# Patient Record
Sex: Female | Born: 1965 | Race: Black or African American | Hispanic: No | Smoking: Never smoker
Health system: Southern US, Community
[De-identification: ages and names within clinical notes are randomized; demographics above are authoritative.]

## PROBLEM LIST (undated history)

## (undated) DIAGNOSIS — J329 Chronic sinusitis, unspecified: Secondary | ICD-10-CM

## (undated) DIAGNOSIS — L639 Alopecia areata, unspecified: Secondary | ICD-10-CM

## (undated) DIAGNOSIS — U071 COVID-19: Secondary | ICD-10-CM

## (undated) DIAGNOSIS — M65311 Trigger thumb, right thumb: Secondary | ICD-10-CM

## (undated) HISTORY — PX: CHOLECYSTECTOMY: SHX55

## (undated) HISTORY — DX: Chronic sinusitis, unspecified: J32.9

## (undated) HISTORY — DX: COVID-19: U07.1

## (undated) HISTORY — PX: OTHER SURGICAL HISTORY: SHX169

## (undated) HISTORY — PX: REDUCTION MAMMAPLASTY: SUR839

## (undated) HISTORY — DX: Alopecia areata, unspecified: L63.9

## (undated) HISTORY — DX: Trigger thumb, right thumb: M65.311

---

## 1997-05-30 HISTORY — PX: BREAST REDUCTION SURGERY: SHX8

## 2004-10-28 ENCOUNTER — Other Ambulatory Visit: Payer: Self-pay

## 2004-10-28 ENCOUNTER — Emergency Department: Payer: Self-pay | Admitting: Emergency Medicine

## 2005-03-10 ENCOUNTER — Ambulatory Visit: Payer: Self-pay | Admitting: Family Medicine

## 2005-03-30 ENCOUNTER — Ambulatory Visit: Payer: Self-pay | Admitting: Family Medicine

## 2006-08-30 ENCOUNTER — Emergency Department: Payer: Self-pay | Admitting: Emergency Medicine

## 2006-10-11 ENCOUNTER — Encounter: Payer: Self-pay | Admitting: Orthopedic Surgery

## 2006-10-29 ENCOUNTER — Encounter: Payer: Self-pay | Admitting: Orthopedic Surgery

## 2009-03-07 ENCOUNTER — Emergency Department: Payer: Self-pay | Admitting: Unknown Physician Specialty

## 2009-05-30 ENCOUNTER — Emergency Department: Payer: Self-pay | Admitting: Emergency Medicine

## 2009-07-28 ENCOUNTER — Emergency Department: Payer: Self-pay | Admitting: Emergency Medicine

## 2009-10-22 ENCOUNTER — Ambulatory Visit: Payer: Self-pay | Admitting: Family Medicine

## 2010-10-26 ENCOUNTER — Ambulatory Visit: Payer: Self-pay | Admitting: Family Medicine

## 2012-06-24 ENCOUNTER — Emergency Department: Payer: Self-pay | Admitting: Internal Medicine

## 2012-06-25 LAB — BETA STREP CULTURE(ARMC)

## 2014-10-19 ENCOUNTER — Emergency Department
Admission: EM | Admit: 2014-10-19 | Discharge: 2014-10-20 | Discharge status: Against Medical Advice | Payer: BC Managed Care – PPO | Attending: Emergency Medicine | Admitting: Emergency Medicine

## 2014-10-19 DIAGNOSIS — N939 Abnormal uterine and vaginal bleeding, unspecified: Secondary | ICD-10-CM | POA: Insufficient documentation

## 2014-10-19 LAB — CBC
HCT: 33.8 % — ABNORMAL LOW (ref 35.0–47.0)
Hemoglobin: 11 g/dL — ABNORMAL LOW (ref 12.0–16.0)
MCH: 27.8 pg (ref 26.0–34.0)
MCHC: 32.5 g/dL (ref 32.0–36.0)
MCV: 85.4 fL (ref 80.0–100.0)
Platelets: 381 10*3/uL (ref 150–440)
RBC: 3.96 MIL/uL (ref 3.80–5.20)
RDW: 16 % — ABNORMAL HIGH (ref 11.5–14.5)
WBC: 8.3 10*3/uL (ref 3.6–11.0)

## 2014-10-19 LAB — URINALYSIS COMPLETE WITH MICROSCOPIC (ARMC ONLY)
BILIRUBIN URINE: NEGATIVE
Bacteria, UA: NONE SEEN
GLUCOSE, UA: NEGATIVE mg/dL
KETONES UR: NEGATIVE mg/dL
LEUKOCYTES UA: NEGATIVE
Nitrite: NEGATIVE
PH: 5 (ref 5.0–8.0)
Protein, ur: NEGATIVE mg/dL
SPECIFIC GRAVITY, URINE: 1.018 (ref 1.005–1.030)

## 2014-10-19 NOTE — ED Notes (Signed)
Pt ambulatory to registration desk with no difficulty. Pt reports she has been having intermittent vaginal bleeding for 4 months. Today the bleeding is heavier and reports she is passing clots.

## 2014-10-19 NOTE — ED Notes (Signed)
Pt states she has had vaginal bleeding for several months.

## 2015-01-31 ENCOUNTER — Emergency Department
Admission: EM | Admit: 2015-01-31 | Discharge: 2015-01-31 | Disposition: A | Discharge status: Home / Self Care | Payer: BC Managed Care – PPO | Attending: Emergency Medicine | Admitting: Emergency Medicine

## 2015-01-31 ENCOUNTER — Emergency Department: Payer: BC Managed Care – PPO

## 2015-01-31 DIAGNOSIS — M25571 Pain in right ankle and joints of right foot: Secondary | ICD-10-CM | POA: Diagnosis not present

## 2015-01-31 DIAGNOSIS — M79671 Pain in right foot: Secondary | ICD-10-CM

## 2015-01-31 MED ORDER — ETODOLAC 400 MG PO TABS: 400.0000 mg | ORAL_TABLET | Freq: Two times a day (BID) | ORAL | Status: DC

## 2015-01-31 NOTE — ED Provider Notes (Signed)
[facility] Emergency Department Provider Note  ____________________________________________  Time seen: Approximately 9:30 PM  I have reviewed the triage vital signs and the nursing notes.   HISTORY  Chief Complaint Ankle Pain   HPI Brianna Silva is a 49 y.o. female is here complaining of right ankle pain for 3 weeks. She is uncertain of any injury and states the pain is worse today. She has taken occasional over-the-counter anti-inflammatory without any relief. She has not taken anything for 2 days. She denies any previous injury to her ankle or foot. She has continued to be ambulatory during the 3 weeks. She rates her pain as an 8 out of 10.   No past medical history on file.  There are no active problems to display for this patient.   No past surgical history on file.  Current Outpatient Rx  Name  Route  Sig  Dispense  Refill  . etodolac (LODINE) 400 MG tablet   Oral   Take 1 tablet (400 mg total) by mouth 2 (two) times daily.   20 tablet   0     Allergies Review of patient's allergies indicates no known allergies.  No family history on file.  Social History Social History  Substance Use Topics  . Smoking status: Not on file  . Smokeless tobacco: Not on file  . Alcohol Use: Not on file    Review of Systems Constitutional: No fever/chills Cardiovascular: Denies chest pain. Respiratory: Denies shortness of breath. Gastrointestinal: No abdominal pain.  No nausea, no vomiting. Musculoskeletal: Negative for back pain. Skin: Negative for rash. Neurological: Negative for headaches, focal weakness or numbness.  10-point ROS otherwise negative.  ____________________________________________   PHYSICAL EXAM:  VITAL SIGNS: ED Triage Vitals  Enc Vitals Group     BP 01/31/15 2105 133/66 mmHg     Pulse Rate 01/31/15 2105 80     Resp 01/31/15 2105 18     Temp 01/31/15 2105 98.6 F (37 C)     Temp Source 01/31/15 2105 Oral   SpO2 01/31/15 2105 96 %     Weight 01/31/15 2105 200 lb (90.719 kg)     Height 01/31/15 2105  (1.676 m)     Head Cir --      Peak Flow --      Pain Score 01/31/15 2109 8     Pain Loc --      Pain Edu? --      Excl. in GC? --     Constitutional: Alert and oriented. Well appearing and in no acute distress. Eyes: Conjunctivae are normal. PERRL. EOMI. Head: Atraumatic. Nose: No congestion/rhinnorhea. Neck: No stridor.   Cardiovascular: Normal rate, regular rhythm. Grossly normal heart sounds.  Good peripheral circulation. Respiratory: Normal respiratory effort.  No retractions. Lungs CTAB. Gastrointestinal: Soft and nontender. No distention. Musculoskeletal: Right foot and ankle there is no gross deformity. There is moderate tenderness on palpation of the dorsal aspect of the right foot. There is no edema present. There is no warmth or redness noted. Digits move without restriction Neurologic:  Normal speech and language. No gross focal neurologic deficits are appreciated. No gait instability. Skin:  Skin is warm, dry and intact. No rash noted. No erythema, ecchymosis or abrasions Psychiatric: Mood and affect are normal. Speech and behavior are normal.  ____________________________________________   LABS (all labs ordered are listed, but only abnormal results are displayed)  Labs Reviewed - No data to display RADIOLOGY  Right foot x-ray was negative.  I, Tommi Rumps, personally viewed and evaluated these images (plain radiographs) as part of my medical decision making.  ____________________________________________   PROCEDURES  Procedure(s) performed: None  Critical Care performed: No  ____________________________________________   INITIAL IMPRESSION / ASSESSMENT AND PLAN / ED COURSE  Pertinent labs & imaging results that were available during my care of the patient were reviewed by me and considered in my medical decision making (see chart for details)  Patient  was started on etodolac twice a day. She will also follow up with podiatrist on call if any continued  problems..   ____________________________________________   FINAL CLINICAL IMPRESSION(S) / ED DIAGNOSES  Final diagnoses:  Foot pain, right      Tommi Rumps, PA-C 02/01/15 [id number]  Arnaldo Natal, MD 02/01/15 [phone]

## 2015-01-31 NOTE — ED Notes (Signed)
Pt c/o right ankle pain for 3 weeks; denies injury; says pain is worse today and she has noticed a "knot"; pt in no acute distress

## 2015-01-31 NOTE — ED Notes (Signed)
PA Rhonda at bedside. 

## 2015-06-10 NOTE — H&P (Signed)
GYN PRE-OP VISIT NOTE  CC: heavy menstrual bleeding, surgical planning  Subjective:    Brianna Silva is a 50 y.o. female who presents for surgical planning for menometrorrhagia.   Started on provera 10mg  daily on 12/23. Finally stopped bleeding on 1/3. Embx done was neg for EIN. TVUS with 1 fundal fibroid 3.3cm, normal EMS (6.5448mm).   Hx of AUB: Patient has been bleeding daily since Thanksgiving with clots as big as golf balls. Going through a box of tampons per week. Associated Pelvic pressure, cramping. No significant pain. Diagnosed with anemia by Family Medicine, started on iron. Had been taking birth control pills, but stopped because her bleeding was still continuing. She is still fatigued.   She has also noted feeling colder more often, but no changes in hair or skin. She experiences the occasional hot flash over the past year. She has noticed that her menses have been heavier with more clots over the past year. No galactorrhea, no changes in breasts (does self breast exams).  This is the first time she has had abnormal bleeding. Her mother had needed a hysterctomy for bleeding. Her sister needed a hysterectomy for abnormal cervical cells. She feels like she will probably need a hysterectomy.   Gynecologic History Patient's last menstrual period was 04/23/2015. Contraception: bilateral tubal ligation 6251yrs ago Sexually active: yes Desires STD screening: no  Last Pap: been a while. Results were: normal Last mammogram: last year. Results were: normal   Menarche: 12 Menses Q28days 3-4 days, heavy - more clots over this past year   Obstetric History                      OB History  Gravida Para Term Preterm AB SAB TAB Ectopic Multiple Living  4 2 2  2 2    2     # Outcome Date GA Lbr Len/2nd Weight Sex Delivery Anes PTL Lv  4 Term           3 Term           2 SAB           1 SAB             NSVD  C/S BTL  Past  Medical History:  has a past medical history of Anemia. Problem List: has Abnormal uterine bleeding (AUB) on her problem list. Past Surgical History:  has a past surgical history that includes Cesarean section; Reduction mammaplasty; and gallstone removal. Family History: family history includes Cirrhosis in her father; Hyperlipidemia in her mother; Thyroid disease in her mother. Social History:  reports that she has never smoked. She has never used smokeless tobacco. She reports that she does not drink alcohol or use illicit drugs. Current Medications: has a current medication list which includes the following prescription(s): cyanocobalamin (vitamin b-12), ferrous sulfate, ginger root, and medroxyprogesterone. Prior to encounter Medications:        Current Outpatient Prescriptions on File Prior to Visit  Medication Sig Dispense Refill  . CYANOCOBALAMIN, VITAMIN B-12, (VITAMIN B-12 ORAL) Take by mouth.    . ferrous sulfate 325 (65 FE) MG tablet Take 325 mg by mouth 2 (two) times daily with meals.    Marland Kitchen. GINGER ROOT (GINGER EXTRACT ORAL) Take by mouth.     No current facility-administered medications on file prior to visit.    Allergies: has No Known Allergies.  Review of Systems 14 systems reviewed pertinent positives and negatives as noted in the HPI and below.  Objective:       Vitals:   06/04/15 0902  BP: (!) 118/102  Pulse: 80   General appearance: alert, appears stated age and cooperative Head: Normocephalic, without obvious abnormality, atraumatic Eyes: conjunctivae/corneas clear. PERRL, EOM's intact. Fundi benign. Sclera anicteric.  CV: RRR PULM: CTAB Abdomen: soft, non-tender; bowel sounds normal; no masses, no organomegaly Pelvic: cervix normal in appearance, external genitalia normal, no adnexal masses or tenderness, no bladder tenderness, no cervical motion tenderness, rectovaginal septum normal, urethra without abnormality or discharge, vagina normal without  discharge and uterus feels enlarged, maybe 12 week size, exam limited by body habitus; 1cm left labial cyst Extremities: extremities normal, atraumatic, no cyanosis or edema Skin: Skin color, texture, turgor normal. No rashes or lesions Lymph nodes: Inguinal adenopathy: none     TVUS:  06/04/15 - Uterus 7.92x4.6cm, EMS 6.45mm, Lt simple ovarian cyst 1.61cm, one fundal fibroid 3cm  EMBX: ENDOMETRIUM, BIOPSY:  NO HYPERPLASIA OR CARCINOMA. INACTIVE ENDOMETRIUM WITH DECIDUALIZED  STROMA.  TSH wnl  Assessment:    49yo [id number] with menometrorrhagia,likely due to fibroid uterus or HPO-axis dysfunction. Also w/ acute blood loss anemia on iron. We discussed hormonal management, novasure ablation, and definitive therapy with hysterectomy. After discussed all the r/b/a, patient desires hysterectomy.  Plan:    1. AUB, likely due to fibroid uterus or HPO-axis dysfunction - r/b/a of hysterectomy discussed with patient, including bleeding, infection, pain, possible bowel/bladder injury, possible transfusion - provera 10mg  daily until surgery  - cont iron for iron deficiency anemia - Counseled on TLH/BS, left labial cyst excision - we discussed possible need for laparotomy due to previous vertical c/s  2. HCM - pap/HPV NILM - patient has mammogram ordered  3. Possible essential hypertension - elevated BPs on two occasions - will order EKG for preop testing  RTC for preop visit  I spent 30 minutes with the patient with greater than 50% spent counseling  Burnett Corrente, MD

## 2015-06-11 ENCOUNTER — Encounter
Admission: RE | Admit: 2015-06-11 | Discharge: 2015-06-11 | Disposition: A | Discharge status: Home / Self Care | Payer: BC Managed Care – PPO | Source: Ambulatory Visit | Attending: Obstetrics and Gynecology | Admitting: Obstetrics and Gynecology

## 2015-06-11 DIAGNOSIS — Z01812 Encounter for preprocedural laboratory examination: Secondary | ICD-10-CM | POA: Insufficient documentation

## 2015-06-11 LAB — TYPE AND SCREEN
ABO/RH(D): B POS
Antibody Screen: NEGATIVE

## 2015-06-11 LAB — CBC
HCT: 37.3 % (ref 35.0–47.0)
Hemoglobin: 11.7 g/dL — ABNORMAL LOW (ref 12.0–16.0)
MCH: 26.8 pg (ref 26.0–34.0)
MCHC: 31.3 g/dL — ABNORMAL LOW (ref 32.0–36.0)
MCV: 85.7 fL (ref 80.0–100.0)
PLATELETS: 387 10*3/uL (ref 150–440)
RBC: 4.36 MIL/uL (ref 3.80–5.20)
RDW: 24.1 % — AB (ref 11.5–14.5)
WBC: 7.1 10*3/uL (ref 3.6–11.0)

## 2015-06-11 LAB — COMPREHENSIVE METABOLIC PANEL
ALBUMIN: 3.7 g/dL (ref 3.5–5.0)
ALT: 11 U/L — ABNORMAL LOW (ref 14–54)
AST: 16 U/L (ref 15–41)
Alkaline Phosphatase: 49 U/L (ref 38–126)
Anion gap: 7 (ref 5–15)
BUN: 9 mg/dL (ref 6–20)
CHLORIDE: 106 mmol/L (ref 101–111)
CO2: 25 mmol/L (ref 22–32)
Calcium: 8.8 mg/dL — ABNORMAL LOW (ref 8.9–10.3)
Creatinine, Ser: 0.64 mg/dL (ref 0.44–1.00)
GFR calc Af Amer: >60 mL/min (ref >60)
GFR calc non Af Amer: >60 mL/min (ref >60)
GLUCOSE: 110 mg/dL — AB (ref 65–99)
POTASSIUM: 3.7 mmol/L (ref 3.5–5.1)
Sodium: 138 mmol/L (ref 135–145)
Total Bilirubin: 0.5 mg/dL (ref 0.3–1.2)
Total Protein: 7.8 g/dL (ref 6.5–8.1)

## 2015-06-11 LAB — ABO/RH: ABO/RH(D): B POS

## 2015-06-11 NOTE — Patient Instructions (Signed)
  Your procedure is scheduled on: Friday Jan. 20, 2017. Report to Same Day Surgery. To find out your arrival time please call [phone] between 1PM - 3PM on Thursday Jan. 19, 2917  Remember: Instructions that are not followed completely may result in serious medical risk, up to and including death, or upon the discretion of your surgeon and anesthesiologist your surgery may need to be rescheduled.    _x___ 1. Do not eat food or drink liquids after midnight. No gum chewing or hard candies.     ____ 2. No Alcohol for 24 hours before or after surgery.   ____ 3. Bring all medications with you on the day of surgery if instructed.    __x__ 4. Notify your doctor if there is any change in your medical condition     (cold, fever, infections).     Do not wear jewelry, make-up, hairpins, clips or nail polish.  Do not wear lotions, powders, or perfumes. You may wear deodorant.  Do not shave 48 hours prior to surgery. Men may shave face and neck.  Do not bring valuables to the hospital.    [facility] is not responsible for any belongings or valuables.               Contacts, dentures or bridgework may not be worn into surgery.  Leave your suitcase in the car. After surgery it may be brought to your room.  For patients admitted to the hospital, discharge time is determined by your treatment team.   Patients discharged the day of surgery will not be allowed to drive home.    Please read over the following fact sheets that you were given:   [facility]  ____ Take these medicines the morning of surgery with A SIP OF WATER: NONE     ____ Fleet Enema (as directed)   _x___ Use CHG Soap as directed  ____ Use inhalers on the day of surgery  ____ Stop metformin 2 days prior to surgery    ____ Take 1/2 of usual insulin dose the night before surgery and none on the morning of surgery.   ____ Stop Coumadin/Plavix/aspirin on does not apply  _x___ Stop  Anti-inflammatories such as Ibuprofen now.  OK to take Tylenol.   _x___ Stop supplements ginger and Vitamin B12. until after surgery.    ____ Bring C-Pap to the hospital.

## 2015-06-12 NOTE — Pre-Procedure Instructions (Addendum)
Abnormal EKG clearance requested (Dr Henrene HawkingKephart) faxed and [facility]. Surgeon notified Karoline Caldwell(Angie)

## 2015-06-18 NOTE — Pre-Procedure Instructions (Signed)
Clearance Note  Brianna Mull, DO - 06/16/2015 3:20 PM EST Formatting of this note may be different from the original. Chief Complaint  Patient presents with  . needs ekg for surgical clearance   HPI: Brianna Silva is a 50 y.o. female here for evaluation of the following:  She has a total hysterectomy scheduled in 4 days. Her obgyn office did a preop exam and her BP was elevated so they also did an EKG (no past dx of HTN). The EKG was reported as abnormal so she was advised to obtain cardiac clearance prior to the surgery - the EKG image and/or report from their office are not available for review today.   Pt's BP is still quite elevated today, initially 170/92, then improved to 150/90 on recheck. Looking back, her BPs have fluctuated into abnormal range multiple times at past visits. She does not monitor BPs at this time. No chest pain, dyspnea, edema. Eating low salt diet; mostly getting in fruits and veggies daily.  BP Readings from Last 6 Encounters:  06/16/15 150/90  06/11/15 155/85  06/04/15 (!) 118/102  05/22/15 142/82  05/08/15 128/82   Has had recent labs as well: Lab Results  Component Value Date  TSH 1.347 05/22/2015   Lab Results  Component Value Date  WBC 9.4 05/08/2015  HGB 9.5 (L) 05/08/2015  HCT 31.0 (L) 05/08/2015  MCV 79 (L) 05/08/2015  PLT 545 (H) 05/08/2015   Last Lipids: Lab Results  Component Value Date  CHOLTOTAL 179 05/08/2015  LDLCALC 91 05/08/2015  HDL 65 05/08/2015  TRIG 116 05/08/2015   Lab Results  Component Value Date  GLUWB 99 05/08/2015   Review of Systems  Constitutional: Negative for appetite change, fatigue and unexpected weight change.  Respiratory: Negative for cough, chest tightness, shortness of breath and wheezing.  Cardiovascular: Negative for chest pain, palpitations and leg swelling.  Neurological: Negative for syncope, weakness, light-headedness, numbness and headaches.   Past History reviewed.  Patient  Active Problem List  Diagnosis  . Abnormal uterine bleeding (AUB)   Outpatient Prescriptions Marked as Taking for the 06/16/15 encounter (Office Visit) with Saintclair Halsted Santayana, DO  Medication Sig Dispense Refill  . medroxyPROGESTERone (PROVERA) 10 MG tablet Take 1 tablet (10 mg total) by mouth once daily. 30 tablet 11   Exam: Visit Vitals  . BP 150/90  . Pulse 90  . Resp 18  . Wt (!) 116.5 kg (256 lb 12.8 oz)  . LMP 04/23/2015  . SpO2 97%  . BMI 41.45 kg/m2   Physical Exam  Constitutional: She is oriented to person, place, and time. She appears well-developed and well-nourished. No distress.  Eyes: Conjunctivae and EOM are normal. Pupils are equal, round, and reactive to light.  Neck: Normal range of motion. Neck supple. No JVD present.  Cardiovascular: Normal rate, regular rhythm, normal heart sounds and intact distal pulses. Exam reveals no friction rub.  No murmur heard. Pulmonary/Chest: Effort normal and breath sounds normal. No respiratory distress. She has no wheezes. She has no rales.  Musculoskeletal: She exhibits no edema.  Neurological: She is alert and oriented to person, place, and time. No cranial nerve deficit.  Skin: Skin is warm and dry.  Psychiatric: She has a normal mood and affect. Her behavior is normal.   Pertinent diagnostic studies:  EKG: nsr at 77bpm, no ectopic beats, normal intervals. Some evidence of LAE, otherwise normal.  ASSESSMENT/PLAN: Brianna Silva was seen today for needs ekg for surgical clearance. Diagnoses and all orders for  this visit:  Essential hypertension -BPs have been consistently elevated over the past month on various occasions. New dx.  -Start new medication hctz. Discussed healthy lifestyle with low-sodium diet and exercise. - hydroCHLOROthiazide (HYDRODIURIL) 25 MG tablet; Take 1 tablet (25 mg total) by mouth once daily.  Pre-op examination EKG has mild abnormality of possible LAE. In context of elevated BPs, no further  workup is indicated at this time and she is asymptomatic with a normal exam. She is cleared for surgery although should have continued follow up of HTN. - ECG 12-lead  Return in about 2 weeks (around 06/30/2015) for f/u HTN.  All questions were answered. The patient and/or caretaker agrees with the plan and any further follow up if necessary.  Plan of Treatment - as of this encounter Upcoming Encounters Upcoming Encounters  Date Type Specialty Care Team Description  07/03/2015 Appointment Obstetrics and Gynecology [place], Loistine Simas, MD  [address] MEDICINE [place], [place] [address]  [phone]  [phone] (Fax)    07/03/2015 Appointment Family Medicine Titus Mould, NP  [address]  [place], [place] [address]  [phone]  [phone] (Fax)    EKG Results - in this encounter   ECG 12-lead (06/16/2015 4:00 PM) ECG 12-lead (06/16/2015 4:00 PM)  Component Value Ref Range  Vent Rate (bpm) 77   PR Interval (msec) 166   QRS Interval (msec) 80   QT Interval (msec) 396   QTc (msec) 448    ECG 12-lead (06/16/2015 4:00 PM)  Specimen Performing Laboratory   [facility] GE MUSE RESULTS    ECG 12-lead (06/16/2015 4:00 PM)  Narrative  Normal sinus rhythm  Left atrial enlargement  Technically poor tracing  Otherwise normal ECG  No previous ECGs available  I reviewed and concur with this report. Electronically signed UU:[email or web address], MD, NEIL ([id number]) on

## 2015-06-19 ENCOUNTER — Ambulatory Visit: Payer: BC Managed Care – PPO | Admitting: Anesthesiology

## 2015-06-19 ENCOUNTER — Encounter
Admission: RE | Disposition: A | Discharge status: Home / Self Care | Payer: Self-pay | Source: Ambulatory Visit | Attending: Obstetrics and Gynecology

## 2015-06-19 ENCOUNTER — Observation Stay
Admission: RE | Admit: 2015-06-19 | Discharge: 2015-06-21 | Disposition: A | Discharge status: Home / Self Care | Payer: BC Managed Care – PPO | Source: Ambulatory Visit | Attending: Obstetrics and Gynecology | Admitting: Obstetrics and Gynecology

## 2015-06-19 DIAGNOSIS — R42 Dizziness and giddiness: Secondary | ICD-10-CM | POA: Insufficient documentation

## 2015-06-19 DIAGNOSIS — D62 Acute posthemorrhagic anemia: Secondary | ICD-10-CM | POA: Diagnosis not present

## 2015-06-19 DIAGNOSIS — N72 Inflammatory disease of cervix uteri: Secondary | ICD-10-CM | POA: Diagnosis not present

## 2015-06-19 DIAGNOSIS — I959 Hypotension, unspecified: Secondary | ICD-10-CM | POA: Insufficient documentation

## 2015-06-19 DIAGNOSIS — Z8349 Family history of other endocrine, nutritional and metabolic diseases: Secondary | ICD-10-CM | POA: Diagnosis not present

## 2015-06-19 DIAGNOSIS — Z9049 Acquired absence of other specified parts of digestive tract: Secondary | ICD-10-CM | POA: Diagnosis not present

## 2015-06-19 DIAGNOSIS — I9581 Postprocedural hypotension: Secondary | ICD-10-CM | POA: Diagnosis not present

## 2015-06-19 DIAGNOSIS — N800 Endometriosis of uterus: Secondary | ICD-10-CM | POA: Diagnosis not present

## 2015-06-19 DIAGNOSIS — N281 Cyst of kidney, acquired: Secondary | ICD-10-CM | POA: Diagnosis not present

## 2015-06-19 DIAGNOSIS — Z79899 Other long term (current) drug therapy: Secondary | ICD-10-CM | POA: Insufficient documentation

## 2015-06-19 DIAGNOSIS — N907 Vulvar cyst: Secondary | ICD-10-CM | POA: Insufficient documentation

## 2015-06-19 DIAGNOSIS — D509 Iron deficiency anemia, unspecified: Secondary | ICD-10-CM | POA: Diagnosis not present

## 2015-06-19 DIAGNOSIS — Z8379 Family history of other diseases of the digestive system: Secondary | ICD-10-CM | POA: Insufficient documentation

## 2015-06-19 DIAGNOSIS — Z90710 Acquired absence of both cervix and uterus: Secondary | ICD-10-CM | POA: Diagnosis present

## 2015-06-19 DIAGNOSIS — N921 Excessive and frequent menstruation with irregular cycle: Secondary | ICD-10-CM | POA: Diagnosis present

## 2015-06-19 DIAGNOSIS — Z832 Family history of diseases of the blood and blood-forming organs and certain disorders involving the immune mechanism: Secondary | ICD-10-CM | POA: Diagnosis not present

## 2015-06-19 DIAGNOSIS — I9589 Other hypotension: Secondary | ICD-10-CM

## 2015-06-19 HISTORY — PX: CYSTOSCOPY: SHX5120

## 2015-06-19 HISTORY — PX: LAPAROSCOPIC HYSTERECTOMY: SHX1926

## 2015-06-19 LAB — POCT PREGNANCY, URINE: PREG TEST UR: NEGATIVE

## 2015-06-19 SURGERY — HYSTERECTOMY, TOTAL, LAPAROSCOPIC
Anesthesia: General | Laterality: Bilateral | Wound class: Clean Contaminated

## 2015-06-19 MED ORDER — LACTATED RINGERS IV SOLN
INTRAVENOUS | Status: DC | PRN
  Administered 2015-06-19 (×3): via INTRAVENOUS

## 2015-06-19 MED ORDER — FENTANYL CITRATE (PF) 100 MCG/2ML IJ SOLN
25.0000 ug | INTRAMUSCULAR | Status: DC | PRN
  Administered 2015-06-19 (×4): 25 ug via INTRAVENOUS

## 2015-06-19 MED ORDER — ACETAMINOPHEN 325 MG PO TABS: 650.0000 mg | ORAL_TABLET | Freq: Four times a day (QID) | ORAL | Status: DC | PRN

## 2015-06-19 MED ORDER — ACETAMINOPHEN 325 MG RE SUPP: 650.0000 mg | Freq: Four times a day (QID) | RECTAL | Status: DC | PRN

## 2015-06-19 MED ORDER — BACITRACIN ZINC 500 UNIT/GM EX OINT
TOPICAL_OINTMENT | CUTANEOUS | Status: AC
  Filled 2015-06-19: qty 28.35

## 2015-06-19 MED ORDER — SENNOSIDES-DOCUSATE SODIUM 8.6-50 MG PO TABS: 1.0000 | ORAL_TABLET | Freq: Every evening | ORAL | Status: DC | PRN

## 2015-06-19 MED ORDER — FLUORESCEIN SODIUM 10 % IJ SOLN
INTRAMUSCULAR | Status: AC
  Filled 2015-06-19: qty 5

## 2015-06-19 MED ORDER — FENTANYL CITRATE (PF) 100 MCG/2ML IJ SOLN
INTRAMUSCULAR | Status: AC
  Administered 2015-06-19: 25 ug via INTRAVENOUS
  Filled 2015-06-19: qty 2

## 2015-06-19 MED ORDER — FAMOTIDINE 20 MG PO TABS
20.0000 mg | ORAL_TABLET | Freq: Once | ORAL | Status: AC
  Administered 2015-06-19: 20 mg via ORAL

## 2015-06-19 MED ORDER — FAMOTIDINE 20 MG PO TABS
ORAL_TABLET | ORAL | Status: AC
Start: 2015-06-19 — End: 2015-06-19
  Administered 2015-06-19: 20 mg via ORAL
  Filled 2015-06-19: qty 1

## 2015-06-19 MED ORDER — ONDANSETRON HCL 4 MG/2ML IJ SOLN: 4.0000 mg | Freq: Once | INTRAMUSCULAR | Status: DC | PRN

## 2015-06-19 MED ORDER — NEOSTIGMINE METHYLSULFATE 10 MG/10ML IV SOLN
INTRAVENOUS | Status: DC | PRN
  Administered 2015-06-19: 4 mg via INTRAVENOUS

## 2015-06-19 MED ORDER — KETOROLAC TROMETHAMINE 15 MG/ML IJ SOLN
15.0000 mg | Freq: Four times a day (QID) | INTRAMUSCULAR | Status: AC
  Administered 2015-06-19: 15 mg via INTRAVENOUS
  Filled 2015-06-19: qty 1

## 2015-06-19 MED ORDER — FENTANYL CITRATE (PF) 100 MCG/2ML IJ SOLN
INTRAMUSCULAR | Status: DC | PRN
  Administered 2015-06-19: 50 ug via INTRAVENOUS
  Administered 2015-06-19 (×2): 100 ug via INTRAVENOUS
  Administered 2015-06-19 (×2): 50 ug via INTRAVENOUS
  Administered 2015-06-19 (×2): 100 ug via INTRAVENOUS

## 2015-06-19 MED ORDER — DIPHENHYDRAMINE HCL 50 MG/ML IJ SOLN: 12.5000 mg | Freq: Four times a day (QID) | INTRAMUSCULAR | Status: DC | PRN

## 2015-06-19 MED ORDER — ROCURONIUM BROMIDE 100 MG/10ML IV SOLN
INTRAVENOUS | Status: DC | PRN
  Administered 2015-06-19: 40 mg via INTRAVENOUS
  Administered 2015-06-19: 10 mg via INTRAVENOUS

## 2015-06-19 MED ORDER — DIPHENHYDRAMINE HCL 12.5 MG/5ML PO ELIX
12.5000 mg | ORAL_SOLUTION | Freq: Four times a day (QID) | ORAL | Status: DC | PRN
  Filled 2015-06-19: qty 5

## 2015-06-19 MED ORDER — ONDANSETRON HCL 4 MG/2ML IJ SOLN: 4.0000 mg | Freq: Four times a day (QID) | INTRAMUSCULAR | Status: DC | PRN

## 2015-06-19 MED ORDER — LACTATED RINGERS IV SOLN
INTRAVENOUS | Status: DC
  Administered 2015-06-19 – 2015-06-20 (×3): via INTRAVENOUS

## 2015-06-19 MED ORDER — MIDAZOLAM HCL 2 MG/2ML IJ SOLN
INTRAMUSCULAR | Status: DC | PRN
  Administered 2015-06-19: 2 mg via INTRAVENOUS

## 2015-06-19 MED ORDER — GLYCOPYRROLATE 0.2 MG/ML IJ SOLN
INTRAMUSCULAR | Status: DC | PRN
  Administered 2015-06-19: 0.6 mg via INTRAVENOUS

## 2015-06-19 MED ORDER — OXYCODONE HCL 5 MG PO TABS
5.0000 mg | ORAL_TABLET | ORAL | Status: DC | PRN
  Administered 2015-06-19 – 2015-06-20 (×5): 10 mg via ORAL
  Filled 2015-06-19 (×5): qty 2

## 2015-06-19 MED ORDER — KETOROLAC TROMETHAMINE 15 MG/ML IJ SOLN
15.0000 mg | Freq: Four times a day (QID) | INTRAMUSCULAR | Status: DC | PRN
  Administered 2015-06-19 – 2015-06-20 (×4): 15 mg via INTRAVENOUS
  Filled 2015-06-19 (×4): qty 1

## 2015-06-19 MED ORDER — DEXAMETHASONE SODIUM PHOSPHATE 10 MG/ML IJ SOLN
INTRAMUSCULAR | Status: DC | PRN
  Administered 2015-06-19: 5 mg via INTRAVENOUS

## 2015-06-19 MED ORDER — LABETALOL HCL 5 MG/ML IV SOLN
INTRAVENOUS | Status: DC | PRN
  Administered 2015-06-19: 10 mg via INTRAVENOUS

## 2015-06-19 MED ORDER — CEFAZOLIN SODIUM-DEXTROSE 2-3 GM-% IV SOLR
2.0000 g | Freq: Three times a day (TID) | INTRAVENOUS | Status: DC
  Administered 2015-06-19 – 2015-06-20 (×4): 2 g via INTRAVENOUS
  Filled 2015-06-19 (×5): qty 50

## 2015-06-19 MED ORDER — BACITRACIN ZINC 500 UNIT/GM EX OINT
TOPICAL_OINTMENT | CUTANEOUS | Status: DC | PRN
  Administered 2015-06-19: 1 via TOPICAL

## 2015-06-19 MED ORDER — BUPIVACAINE HCL (PF) 0.25 % IJ SOLN
INTRAMUSCULAR | Status: AC
  Filled 2015-06-19: qty 30

## 2015-06-19 MED ORDER — ONDANSETRON HCL 4 MG/2ML IJ SOLN
INTRAMUSCULAR | Status: DC | PRN
  Administered 2015-06-19: 4 mg via INTRAVENOUS

## 2015-06-19 MED ORDER — CEFAZOLIN SODIUM-DEXTROSE 2-3 GM-% IV SOLR
INTRAVENOUS | Status: AC
Start: 2015-06-19 — End: 2015-06-19
  Administered 2015-06-19: 2 g via INTRAVENOUS
  Filled 2015-06-19: qty 50

## 2015-06-19 MED ORDER — ONDANSETRON 8 MG PO TBDP: 4.0000 mg | ORAL_TABLET | Freq: Four times a day (QID) | ORAL | Status: DC | PRN

## 2015-06-19 MED ORDER — PROPOFOL 10 MG/ML IV BOLUS
INTRAVENOUS | Status: DC | PRN
  Administered 2015-06-19: 200 mg via INTRAVENOUS

## 2015-06-19 MED ORDER — BUPIVACAINE HCL 0.25 % IJ SOLN
INTRAMUSCULAR | Status: DC | PRN
  Administered 2015-06-19: 10 mL

## 2015-06-19 MED ORDER — LACTATED RINGERS IV SOLN
INTRAVENOUS | Status: DC
  Administered 2015-06-19: 08:00:00 via INTRAVENOUS

## 2015-06-19 SURGICAL SUPPLY — 55 items
APPLICATOR ARISTA FLEXITIP XL (MISCELLANEOUS) ×4 IMPLANT
ARISTA 1 G IMPLANT
BAG URO DRAIN 2000ML W/SPOUT (MISCELLANEOUS) ×4 IMPLANT
BLADE SURG SZ11 CARB STEEL (BLADE) ×4 IMPLANT
CANISTER SUCT 1200ML W/VALVE (MISCELLANEOUS) ×4 IMPLANT
CATH FOLEY 2WAY  5CC 16FR (CATHETERS) ×2
CATH URTH 16FR FL 2W BLN LF (CATHETERS) ×2 IMPLANT
CHLORAPREP W/TINT 26ML (MISCELLANEOUS) ×4 IMPLANT
COUNTER NEEDLE 20/40 LG (NEEDLE) ×4 IMPLANT
DEFOGGER SCOPE WARMER CLEARIFY (MISCELLANEOUS) ×4 IMPLANT
DEVICE SUTURE ENDOST 10MM (ENDOMECHANICALS) IMPLANT
DRSG TEGADERM 2-3/8X2-3/4 SM (GAUZE/BANDAGES/DRESSINGS) IMPLANT
GAUZE SPONGE NON-WVN 2X2 STRL (MISCELLANEOUS) IMPLANT
GLOVE BIO SURGEON STRL SZ 6 (GLOVE) ×24 IMPLANT
GLOVE INDICATOR 6.5 STRL GRN (GLOVE) ×12 IMPLANT
GOWN STRL REUS W/ TWL LRG LVL3 (GOWN DISPOSABLE) ×4 IMPLANT
GOWN STRL REUS W/ TWL XL LVL3 (GOWN DISPOSABLE) ×2 IMPLANT
GOWN STRL REUS W/TWL LRG LVL3 (GOWN DISPOSABLE) ×4
GOWN STRL REUS W/TWL MED LVL3 (GOWN DISPOSABLE) ×4 IMPLANT
GOWN STRL REUS W/TWL XL LVL3 (GOWN DISPOSABLE) ×2
HANDLE YANKAUER SUCT BULB TIP (MISCELLANEOUS) ×4 IMPLANT
HEMOSTAT ARISTA ABSORB 1G (Miscellaneous) ×4 IMPLANT
IRRIGATION STRYKERFLOW (MISCELLANEOUS) ×2 IMPLANT
IRRIGATOR STRYKERFLOW (MISCELLANEOUS) ×4
IV LACTATED RINGERS 1000ML (IV SOLUTION) ×4 IMPLANT
KIT PINK PAD W/HEAD ARE REST (MISCELLANEOUS) ×4
KIT PINK PAD W/HEAD ARM REST (MISCELLANEOUS) ×2 IMPLANT
KIT RM TURNOVER CYSTO AR (KITS) ×4 IMPLANT
LABEL OR SOLS (LABEL) ×4 IMPLANT
LIGASURE BLUNT 5MM 37CM (INSTRUMENTS) ×4 IMPLANT
LIQUID BAND (GAUZE/BANDAGES/DRESSINGS) ×4 IMPLANT
MANIPULATOR VCARE LG CRV RETR (MISCELLANEOUS) ×4 IMPLANT
MANIPULATOR VCARE STD CRV RETR (MISCELLANEOUS) IMPLANT
NEEDLE VERESS 14GA 120MM (NEEDLE) ×4 IMPLANT
NS IRRIG 500ML POUR BTL (IV SOLUTION) ×4 IMPLANT
OCCLUDER COLPOPNEUMO (BALLOONS) ×4 IMPLANT
PACK GYN LAPAROSCOPIC (MISCELLANEOUS) ×4 IMPLANT
PAD OB MATERNITY 4.3X12.25 (PERSONAL CARE ITEMS) ×4 IMPLANT
PAD PREP 24X41 OB/GYN DISP (PERSONAL CARE ITEMS) ×4 IMPLANT
SCISSORS METZENBAUM CVD 33 (INSTRUMENTS) IMPLANT
SET CYSTO W/LG BORE CLAMP LF (SET/KITS/TRAYS/PACK) ×4 IMPLANT
SLEEVE ENDOPATH XCEL 5M (ENDOMECHANICALS) ×4 IMPLANT
SPONGE LAP 18X18 5 PK (GAUZE/BANDAGES/DRESSINGS) ×4 IMPLANT
SPONGE VERSALON 2X2 STRL (MISCELLANEOUS)
SPONGE XRAY 4X4 16PLY STRL (MISCELLANEOUS) ×4 IMPLANT
SUT ENDO VLOC 180-0-8IN (SUTURE) ×8 IMPLANT
SUT MNCRL 3 0 RB1 (SUTURE) ×2 IMPLANT
SUT MONOCRYL 3 0 RB1 (SUTURE) ×2
SUT VIC AB 0 CT1 36 (SUTURE) ×4 IMPLANT
SUT VIC AB 2-0 UR6 27 (SUTURE) IMPLANT
SYR 50ML LL SCALE MARK (SYRINGE) ×4 IMPLANT
SYRINGE 10CC LL (SYRINGE) ×4 IMPLANT
TROCAR ENDO BLADELESS 11MM (ENDOMECHANICALS) ×4 IMPLANT
TROCAR XCEL NON-BLD 5MMX100MML (ENDOMECHANICALS) ×4 IMPLANT
TUBING INSUFFLATOR HEATED (MISCELLANEOUS) ×4 IMPLANT

## 2015-06-19 NOTE — Anesthesia Procedure Notes (Signed)
Procedure Name: Intubation Date/Time: 06/19/2015 9:16 AM Performed by: Edyth Gunnels Pre-anesthesia Checklist: Patient identified, Suction available, Patient being monitored, Timeout performed and Emergency Drugs available Patient Re-evaluated:Patient Re-evaluated prior to inductionOxygen Delivery Method: Circle system utilized Preoxygenation: Pre-oxygenation with 100% oxygen Intubation Type: IV induction Ventilation: Mask ventilation without difficulty Laryngoscope Size: Mac and 3 Grade View: Grade II Tube type: Oral Tube size: 7.0 mm Number of attempts: 1 Airway Equipment and Method: Stylet Placement Confirmation: ETT inserted through vocal cords under direct vision,  positive ETCO2 and breath sounds checked- equal and bilateral Secured at: 23 cm Tube secured with: Tape Dental Injury: Teeth and Oropharynx as per pre-operative assessment

## 2015-06-19 NOTE — Anesthesia Postprocedure Evaluation (Signed)
Anesthesia Post Note  Patient: Brianna Silva  Procedure(s) Performed: Procedure(s) (LRB): HYSTERECTOMY TOTAL LAPAROSCOPIC/BILATERAL SALPINGECTOMY/LABIAL CYST EXCISION (Bilateral) CYSTOSCOPY  Patient location during evaluation: PACU Anesthesia Type: General Level of consciousness: awake and alert Pain management: pain level controlled Vital Signs Assessment: post-procedure vital signs reviewed and stable Respiratory status: spontaneous breathing and respiratory function stable Cardiovascular status: stable Anesthetic complications: no    Last Vitals:  Filed Vitals:   06/19/15 0837 06/19/15 1259  BP:  121/72  Pulse:  64  Temp: 36.9 C 36.4 C  Resp:  16    Last Pain:  Filed Vitals:   06/19/15 1304  PainSc: Asleep                 KEPHART,WILLIAM K

## 2015-06-19 NOTE — Op Note (Signed)
Total Laparoscopic Hysterectomy Procedure Note  Indications: AUB  Pre-operative Diagnosis: AUB, left labial cyst  Post-operative Diagnosis: same  Operation: TOTAL LAPAROSCOPIC HYSTERECTOMY, BILATERAL SALPINGECTOMY, CYSTOSCOPY, LEFT LABIAL CYST INCISION AND DRAINAGE  Surgeon: Ala Dach , MD  Assistants: Jennell Corner, MD  Anesthesia: General endotracheal anesthesia   Procedure Details  The patient was seen in the Holding Room. The risks, benefits, complications, treatment options, and expected outcomes were discussed with the patient.  The patient concurred with the proposed plan, giving informed consent. The patient was taken to Operating Room # 5, identified as Brianna Silva and the procedure verified as above. A Time Out was held and the above information confirmed. Ancef 2g was given prior to incision.   After induction of anesthesia, the patient was draped and prepped in the usual sterile manner. Pt was placed in low lithotomy. Foley catheter was placed.  The uterus sounded to 8cm. A large V-care device with pneumo-occluder was selected and placed onto the cervix and locked in place. Attention was turned to the abdomen. 4mL of 0.25% bupivicaine was injected into the posterior umbilical fold. A 32mm-incision was made with the 11# scalpel. A Veress needle was introduced into the abdomen, with a low opening pressure confirmed to be <29mmHg. The abdomen was then insufflated with carbon dioxide gas and adequate pneumoperitoneum was obtained. A 5-mm trocar and sleeve were then advanced without difficulty with the laparoscope under direct visualization into the abdomen. A 10-mm incision was made in the left lower quadrant and a 10-mm port was placed under direct visualization. A 5-mm incision was made in the right lower quadrant and a 5-mm port was placed under direct visualization. A survey of the patient's pelvis and abdomen revealed the findings as above. Bilateral ureters were  located.The round ligaments were clamped and transected with the Ligasure device on both sides. The bilateral infundibulopelvic ligaments were also clamped and transected with the Ligasure device. The bladder peritoneum was dissected away from the vaginal cuff. The uterus was followed to the uterine arteries, which were clamped, cauterized, and transected with the Ligasure device. A straight releasing bite on both sides was also done using the ligasure. The bilateral tubes were then dissected from the mesosalpinx using the ligasure device and removed from the abdomen through the 10-mm trochar and sent for pathology.    The pneumo-occluder was inflated and the colpotomy was performed against the V-care cup with bipolar energy using the scissors.  Excellent hemostasis was noted. The specimen was removed through the vagina. A 0-V-Loc endostitch was used to close the vaginal cuff, incorporating the uterosacral pedicles bilaterally.    The Foley catheter was temporarily removed and a cystoscopy was performed. A stitch of the V-loc was noted in the right anterior bladder dome. 1cc of floresene was administered and bilateral yellow jets were visualized from bilateral ureters.   Attention was then turned to her pelvis and the V-loc suture was cut from the vaginal cuff and removed. The vaginal cuff was then closed vaginally in a running fashion with 0 Vicryl with care given to incorporate the uterosacral pedicles bilaterally, though this was difficult. Hemostasis was excellent along the suture line.   The Foley catheter was temporarily removed and a cystoscopy was again performed. The bladder mucosa appeared intact without bleeding or injury. The Foley catheter was replaced for postoperative care.   The 2mm labial cyst was opened with a mosquito clamp and curd-like thick discharge was removed. Hemostasis was noted.   Attention was  then returned to her abdomen which was insufflated again with carbon dioxide  gas. The laparoscope was used to survey the operative site, and it was found to be gently oozy. This was controlled with Avitene for excellent hemostasis. No intraoperative injury to other surrounding organs was noted. The cone device was used to close the 10mm trochar with 0-Vicryl. The abdomen was desufflated and all instruments were then removed from the patient's abdomen.   All skin incisions were closed with 4-0 monofilament, steristrips/benzoine, and telfa/tegrederm dressings. The patient tolerated the procedures well. A vaginal pack covered with bacitracin was placed in the vagina. All instruments, needles, and sponge counts were correct x 2. The patient was taken to the recovery room awake, extubated and in stable condition. The Foley will stay in place for 5-7 days given the bladder injury.   Findings: Dense adhesions anterior of uterus to bladder Dense adhesions of fallopian tubes to sidewall Dense adhesions of omentum just inferior to umbilicus  Estimated Blood Loss:         Drains: Foley cathetar         Total IV Fluids:         Specimens: Uterus, cervix, bilateral tubes         Complications:  V-Loc stitch through the anterior dome of the bladder, removed; cystoscopy normal after removal         Disposition: PACU - hemodynamically stable.         Condition: stable  Ala Dach, MD

## 2015-06-19 NOTE — Transfer of Care (Signed)
Immediate Anesthesia Transfer of Care Note  Patient: GAGANDEEP KOSSMAN  Procedure(s) Performed: Procedure(s): HYSTERECTOMY TOTAL LAPAROSCOPIC/BILATERAL SALPINGECTOMY/LABIAL CYST EXCISION (Bilateral) CYSTOSCOPY  Patient Location: PACU  Anesthesia Type:General  Level of Consciousness: awake, alert  and oriented  Airway & Oxygen Therapy: Patient Spontanous Breathing, Patient connected to nasal cannula oxygen and Patient connected to face mask oxygen  Post-op Assessment: Report given to RN and Post -op Vital signs reviewed and stable  Post vital signs: Reviewed and stable  Last Vitals:  Filed Vitals:   06/19/15 0837 06/19/15 1259  BP:  121/72  Pulse:  64  Temp: 36.9 C 36.4 C  Resp:  16    Complications: No apparent anesthesia complications

## 2015-06-19 NOTE — Progress Notes (Signed)
GYN Post-op Check  S: Patient doing well. Abdominal pain controlled with pain medication, no n/v, feels sleepy  O: Filed Vitals:   06/19/15 1604 06/19/15 1702  BP: 108/65 112/68  Pulse: 68 70  Temp: 98.5 F (36.9 C) 98.2 F (36.8 C)  Resp: 20 18   GEN: NAD, sleepy ABD: soft, distended, incisions covered, nontender EXT: wwp GU: foley in place, vag pack in pace  A/P: 49yo G2P2 POD#0 s/p TLH, BS, cystoscopy c/b bladder injury. Foley in place for 5 days to allow for bladder healing. Doing well.   1. Neuro - pain control w/ percocet and toradol  2. PULM - incentive spirometry  3. CV - monitor BPs  4. GU - foley for 5 days - strict i/o  5. Heme - cbc tomorrow  6. Renal - check CMP in the AM  7. GYN - remove vag pack in am  8. ABD - remove top dressing in the AM, steris for 1 week  D/c POD#1 if meeting all milestones  Ala Dach, MD

## 2015-06-19 NOTE — Anesthesia Preprocedure Evaluation (Signed)
Anesthesia Evaluation  Patient identified by MRN, date of birth, ID band Patient awake    Reviewed: Allergy & Precautions, NPO status , Patient's Chart, lab work & pertinent test results  History of Anesthesia Complications Negative for: history of anesthetic complications  Airway Mallampati: I       Dental   Pulmonary neg pulmonary ROS,           Cardiovascular hypertension, Pt. on medications      Neuro/Psych negative neurological ROS     GI/Hepatic negative GI ROS, Neg liver ROS, GERD  Medicated,  Endo/Other  negative endocrine ROS  Renal/GU negative Renal ROS     Musculoskeletal   Abdominal   Peds  Hematology negative hematology ROS (+)   Anesthesia Other Findings   Reproductive/Obstetrics                             Anesthesia Physical Anesthesia Plan  ASA: II  Anesthesia Plan: General   Post-op Pain Management:    Induction: Intravenous  Airway Management Planned: Oral ETT  Additional Equipment:   Intra-op Plan:   Post-operative Plan:   Informed Consent: I have reviewed the patients History and Physical, chart, labs and discussed the procedure including the risks, benefits and alternatives for the proposed anesthesia with the patient or authorized representative who has indicated his/her understanding and acceptance.     Plan Discussed with:   Anesthesia Plan Comments:         Anesthesia Quick Evaluation

## 2015-06-19 NOTE — Interval H&P Note (Signed)
History and Physical Interval Note:  06/19/2015 8:31 AM  Brianna Silva  has presented today for surgery, with the diagnosis of AUB  The various methods of treatment have been discussed with the patient and family. After consideration of risks, benefits and other options for treatment, the patient has consented to  Procedure(s): HYSTERECTOMY TOTAL LAPAROSCOPIC/BILATERAL SALPINGECTOMY (Bilateral) as a surgical intervention .  The patient's history has been reviewed, patient examined, no change in status, stable for surgery.  I have reviewed the patient's chart and labs.  Questions were answered to the patient's satisfaction.     Leonette Most Jyasia Markoff

## 2015-06-20 ENCOUNTER — Observation Stay: Payer: BC Managed Care – PPO

## 2015-06-20 DIAGNOSIS — N72 Inflammatory disease of cervix uteri: Secondary | ICD-10-CM | POA: Diagnosis not present

## 2015-06-20 LAB — CBC
HCT: 34.7 % — ABNORMAL LOW (ref 35.0–47.0)
HEMATOCRIT: 33.5 % — AB (ref 35.0–47.0)
HEMOGLOBIN: 10.9 g/dL — AB (ref 12.0–16.0)
Hemoglobin: 11 g/dL — ABNORMAL LOW (ref 12.0–16.0)
MCH: 27 pg (ref 26.0–34.0)
MCH: 28.1 pg (ref 26.0–34.0)
MCHC: 31.8 g/dL — ABNORMAL LOW (ref 32.0–36.0)
MCHC: 32.4 g/dL (ref 32.0–36.0)
MCV: 84.7 fL (ref 80.0–100.0)
MCV: 86.7 fL (ref 80.0–100.0)
PLATELETS: 346 10*3/uL (ref 150–440)
PLATELETS: 305 10*3/uL (ref 150–440)
RBC: 4.1 MIL/uL (ref 3.80–5.20)
RBC: 3.87 MIL/uL (ref 3.80–5.20)
RDW: 22.8 % — AB (ref 11.5–14.5)
RDW: 22.2 % — ABNORMAL HIGH (ref 11.5–14.5)
WBC: 11.5 10*3/uL — ABNORMAL HIGH (ref 3.6–11.0)
WBC: 9.7 10*3/uL (ref 3.6–11.0)

## 2015-06-20 LAB — BASIC METABOLIC PANEL
ANION GAP: 4 — AB (ref 5–15)
ANION GAP: 5 (ref 5–15)
BUN: 12 mg/dL (ref 6–20)
BUN: 12 mg/dL (ref 6–20)
CHLORIDE: 108 mmol/L (ref 101–111)
CHLORIDE: 108 mmol/L (ref 101–111)
CO2: 25 mmol/L (ref 22–32)
CO2: 25 mmol/L (ref 22–32)
Calcium: 8.2 mg/dL — ABNORMAL LOW (ref 8.9–10.3)
Calcium: 8.1 mg/dL — ABNORMAL LOW (ref 8.9–10.3)
Creatinine, Ser: 0.7 mg/dL (ref 0.44–1.00)
Creatinine, Ser: 0.78 mg/dL (ref 0.44–1.00)
GFR calc Af Amer: >60 mL/min (ref >60)
GFR calc non Af Amer: >60 mL/min (ref >60)
GLUCOSE: 117 mg/dL — AB (ref 65–99)
Glucose, Bld: 105 mg/dL — ABNORMAL HIGH (ref 65–99)
POTASSIUM: 3.6 mmol/L (ref 3.5–5.1)
POTASSIUM: 3.9 mmol/L (ref 3.5–5.1)
SODIUM: 137 mmol/L (ref 135–145)
Sodium: 138 mmol/L (ref 135–145)

## 2015-06-20 MED ORDER — OXYCODONE HCL 5 MG PO TABS: 10.0000 mg | ORAL_TABLET | ORAL | Status: DC | PRN

## 2015-06-20 MED ORDER — SODIUM CHLORIDE 0.9 % IV BOLUS (SEPSIS)
1000.0000 mL | Freq: Once | INTRAVENOUS | Status: AC
  Administered 2015-06-20: 1000 mL via INTRAVENOUS

## 2015-06-20 MED ORDER — SENNOSIDES-DOCUSATE SODIUM 8.6-50 MG PO TABS: 1.0000 | ORAL_TABLET | Freq: Every day | ORAL | Status: DC

## 2015-06-20 MED ORDER — SIMETHICONE 80 MG PO CHEW
80.0000 mg | CHEWABLE_TABLET | Freq: Four times a day (QID) | ORAL | Status: DC | PRN
  Administered 2015-06-20 – 2015-06-21 (×3): 80 mg via ORAL
  Filled 2015-06-20 (×3): qty 1

## 2015-06-20 MED ORDER — SIMETHICONE 80 MG PO CHEW: 80.0000 mg | CHEWABLE_TABLET | Freq: Four times a day (QID) | ORAL | Status: DC | PRN

## 2015-06-20 NOTE — Progress Notes (Signed)
Went in to ambulate pt. Encouraged to get up while pain meds in effect. Pt. Said too sleepy to get up. Said she has same pain level as before. She said she needed to sleep from pain med and i informed her i would be back in an hour to ambulate with her.

## 2015-06-20 NOTE — Progress Notes (Signed)
GYN POD#1  S: Patient doing well. Abdominal pain controlled with pain medication, no n/v, feels sleepy. Tolerating small amounts of PO. Feels light headed when getting up. +Flatus.   OCeasar Mons Vitals:   06/20/15 0712 06/20/15 1124  BP: 120/61 114/58  Pulse: 64 69  Temp: 97.9 F (36.6 C) 98.2 F (36.8 C)  Resp: 18 18   UOP: 125/2 hrs  GEN: NAD, sleepy ABD: soft, distended, incisions healing well EXT: wwp GU: foley in place, vag pack removed  CMP Latest Ref Rng 06/20/2015 06/11/2015  Glucose 65 - 99 mg/dL 161(W) 960(A)  BUN 6 - 20 mg/dL 12 9  Creatinine 5.40 - 1.00 mg/dL 9.81 1.91  Sodium 478 - 145 mmol/L 137 138  Potassium 3.5 - 5.1 mmol/L 3.6 3.7  Chloride 101 - 111 mmol/L 108 106  CO2 22 - 32 mmol/L 25 25  Calcium 8.9 - 10.3 mg/dL 8.2(L) 8.8(L)  Total Protein 6.5 - 8.1 g/dL - 7.8  Total Bilirubin 0.3 - 1.2 mg/dL - 0.5  Alkaline Phos 38 - 126 U/L - 49  AST 15 - 41 U/L - 16  ALT 14 - 54 U/L - 11(L)    CBC Latest Ref Rng 06/20/2015 06/11/2015 10/19/2014  WBC 3.6 - 11.0 K/uL 11.5(H) 7.1 8.3  Hemoglobin 12.0 - 16.0 g/dL 11.0(L) 11.7(L) 11.0(L)  Hematocrit 35.0 - 47.0 % 34.7(L) 37.3 33.8(L)  Platelets 150 - 440 K/uL 346 387 381      A/P: 49yo G2P2 POD#1 s/p TLH, BS, cystoscopy c/b bladder injury. Foley in place for 5 days to allow for bladder healing. Doing well. Still needs to ambulate. UOP low for body weight, will bolus with 1L LR.  1. Neuro - pain control w/ percocet and toradol  2. PULM - incentive spirometry  3. CV - monitor BPs  4. GU - foley for 5 days - strict i/o - bolus 1L  5. Heme - cbc stable  6. Renal - creatinine normal  7. GYN - removed vag pack, minimal bleeding  8. ABD - steris for 1 week  D/c POD#1 if meeting all milestones  Ala Dach, MD

## 2015-06-21 DIAGNOSIS — N72 Inflammatory disease of cervix uteri: Secondary | ICD-10-CM | POA: Diagnosis not present

## 2015-06-21 LAB — BASIC METABOLIC PANEL
ANION GAP: 4 — AB (ref 5–15)
BUN: 11 mg/dL (ref 6–20)
CHLORIDE: 111 mmol/L (ref 101–111)
CO2: 25 mmol/L (ref 22–32)
Calcium: 8.1 mg/dL — ABNORMAL LOW (ref 8.9–10.3)
Creatinine, Ser: 0.63 mg/dL (ref 0.44–1.00)
GFR calc non Af Amer: >60 mL/min (ref >60)
Glucose, Bld: 93 mg/dL (ref 65–99)
POTASSIUM: 3.9 mmol/L (ref 3.5–5.1)
SODIUM: 140 mmol/L (ref 135–145)

## 2015-06-21 LAB — CBC
HEMATOCRIT: 33.5 % — AB (ref 35.0–47.0)
HEMOGLOBIN: 10.9 g/dL — AB (ref 12.0–16.0)
MCH: 28.2 pg (ref 26.0–34.0)
MCHC: 32.4 g/dL (ref 32.0–36.0)
MCV: 86.9 fL (ref 80.0–100.0)
Platelets: 269 10*3/uL (ref 150–440)
RBC: 3.86 MIL/uL (ref 3.80–5.20)
RDW: 22.7 % — ABNORMAL HIGH (ref 11.5–14.5)
WBC: 7.7 10*3/uL (ref 3.6–11.0)

## 2015-06-21 NOTE — Discharge Instructions (Signed)
Total Laparoscopic Hysterectomy, Care After Refer to this sheet in the next few weeks. These instructions provide you with information on caring for yourself after your procedure. Your health care provider may also give you more specific instructions. Your treatment has been planned according to current medical practices, but problems sometimes occur. Call your health care provider if you have any problems or questions after your procedure. WHAT TO EXPECT AFTER THE PROCEDURE  Pain and bruising at the incision sites. You will be given pain medicine to control it.  Sore throat from the breathing tube that was inserted during surgery. HOME CARE INSTRUCTIONS  Only take over-the-counter or prescription medicines for pain, discomfort, or fever as directed by your health care provider.   Do not take aspirin. It can cause bleeding.   Do not drive when taking pain medicine.   Follow your health care provider's advice regarding diet, exercise, lifting, driving, and general activities. - NO HEAVY LIFTING MORE THAN 10 LBS for 4 WEEKS  NOTHING IN VAGINA FOR 4 WEEKS, NO SEX, NO BATHS, NO TAMPONS, NO DOUCHING  SHOWERS OK  Resume your usual diet as directed and allowed.   Get plenty of rest and sleep.   Remove steristrips in 7 days   Monitor your temperature and notify your health care provider of a fever.   Do not drink alcohol until your health care provider gives you permission.   If you develop constipation, you may take a mild laxative with your health care provider's permission. Bran foods may help with constipation problems. Drinking enough fluids to keep your urine clear or pale yellow may help as well.   Try to have someone home with you for 1-2 weeks to help around the house.   Keep all of your follow-up appointments as directed by your health care provider.  SEEK MEDICAL CARE IF:  You have swelling, redness, or increasing pain around your incision sites.   You have  pus coming from your incision.   You notice a bad smell coming from your incision.   Your incision breaks open.   You feel dizzy or lightheaded.   You have pain or bleeding when you urinate.   You have persistent diarrhea.   You have persistent nausea and vomiting.   You have abnormal vaginal discharge.   You have a rash.   You have any type of abnormal reaction or develop an allergy to your medicine.   You have poor pain control with your prescribed medicine.  SEEK IMMEDIATE MEDICAL CARE IF:  You have chest pain or shortness of breath.  You have severe abdominal pain that is not relieved with pain medicine.  You have pain or swelling in your legs. MAKE SURE YOU:  Understand these instructions.  Will watch your condition.  Will get help right away if you are not doing well or get worse.   This information is not intended to replace advice given to you by your health care provider. Make sure you discuss any questions you have with your health care provider.   Document Released: 03/06/2013 Document Revised: 05/21/2013 Document Reviewed: 03/06/2013 Elsevier Interactive Patient Education 2016 [employer]  REMOVAL OF FOLEY Please come to the office on Wednesday in [place] for a trial of void. Please drink lots of water prior to the visit.   PLEASE STOP TAKING YOUR HIGH BLOOD PRESSURE MEDICINE UNTIL SEEN IN THE OFFICE ON Wednesday  Call your doctor for increased pain or vaginal bleeding, temperature above 100.4, depression, or concerns.  No strenuous activity or heavy lifting for 6 weeks.  No intercourse, tampons, douching, or enemas for 6 weeks.  No tub baths-showers only.  No driving for 2 weeks or while taking pain medications.  Keep incisions clean and dry.  Call your doctor for incision concerns including redness, swelling, bleeding or drainage, or if begins to come apart.  Keep foley catheter clean as instructed in hospital.  Empty at least every 8 hours or  before bag is full.

## 2015-06-21 NOTE — Discharge Summary (Signed)
Physician Discharge Summary  Patient ID: Brianna Silva MRN: [id number] DOB/AGE: 1965-12-06 50 y.o.  Admit date: 06/19/2015 Discharge date: 06/21/2015  Admission Diagnoses: Total Laparoscopic Hysterectomy, Bilateral Salpingectomy, cystoscopy,   Discharge Diagnoses:  Active Problems:   S/P laparoscopic hysterectomy   Discharged Condition: good  Hospital Course: Patient underwent a TLH/BS, cystoscopy c/b bladder injury with V-Loc suture (<17mm) without requiring repair. She had several episodes of dizziness when trying to get out of bed with relative hypotension (100's/50's). Her UOP was borderline during POD#1. She underwent an abdominal ultrasound which was normal (no FF, no hydronephrosis). Her creatinine and cbc were stable x 2. The remaining of her vitals were normal. Her UOP was adequate (0.6cc/kg/hr). She wanted to stay an extra night because she still didn't feel strong enough to go home.  On POD#2, she felt much improved and was discharged home. Because of the incidental bladder injury, she will go home with a foley and leg bag until POD#5.   Consults: None  Significant Diagnostic Studies: radiology: Ultrasound: abdomen. Normal  Treatments: IV hydration and analgesia: perocet and toradol   Discharge Exam: Blood pressure 145/75, pulse 66, temperature 98 F (36.7 C), temperature source Oral, resp. rate 20, height  (1.676 m), weight 113.399 kg (250 lb), last menstrual period 06/09/2015, SpO2 99 %. General appearance: alert, cooperative and no distress Resp: clear to auscultation bilaterally Cardio: regular rate and rhythm, S1, S2 normal, no murmur, click, rub or gallop GI: soft, non-tender; bowel sounds normal; no masses,  no organomegaly Pelvic: normal vaginal spotting, s/p vaginal pack Extremities: extremities normal, atraumatic, no cyanosis or edema Incision/Wound:umbilicus healing well, RLQ healing well with steristrip removed, LLQ incision healing well, steristrip in  place  Disposition: 01-Home or Self Care     Medication List    STOP taking these medications        hydrochlorothiazide 25 MG tablet  Commonly known as:  HYDRODIURIL     medroxyPROGESTERone 10 MG tablet  Commonly known as:  PROVERA      TAKE these medications        GINGER EXTRACT PO  Take 1 capsule by mouth 3 (three) times daily.     oxyCODONE 5 MG immediate release tablet  Commonly known as:  Oxy IR/ROXICODONE  Take 2 tablets (10 mg total) by mouth every 4 (four) hours as needed for severe pain (1 tab for moderate pain. take with colace).     senna-docusate 8.6-50 MG tablet  Commonly known as:  Senokot-S  Take 1 tablet by mouth at bedtime.     simethicone 80 MG chewable tablet  Commonly known as:  MYLICON  Chew 1 tablet (80 mg total) by mouth 4 (four) times daily as needed for flatulence.     VITAMIN B 12 PO  Take 1 tablet by mouth every morning.           Follow-up Information    Follow up with Ala Dach, MD In 2 weeks.   Specialty:  Obstetrics and Gynecology   Why:  For wound re-check   Contact information:   [address] [place] [address] [phone]       Follow up with Ala Dach, MD In 4 weeks.   Specialty:  Obstetrics and Gynecology   Contact information:   [address] [place] [address] [phone]       Follow up with Ala Dach, MD On 06/24/2015.   Specialty:  Obstetrics and Gynecology   Why:  Trial of void and foley removal   Contact information:   [address] [place] [address] [phone]       Signed: Ala Dach 06/21/2015, 3:37 PM

## 2015-06-21 NOTE — Progress Notes (Addendum)
Obstetric and Gynecology  Subjective  GYN POD#2:  S: Pt. Reports feeling much better today.  She has been up several times in her room without difficulty.  Sitting up and eating breakfast now without nausea/vomiting. +Flatus . Reports she has not required pain medication since last night.    Objective   Filed Vitals:   06/21/15 0334 06/21/15 0852  BP: 108/62 133/69  Pulse: 74 78  Temp: 98.4 F (36.9 C) 98.7 F (37.1 C)  Resp: 16 18     Intake/Output Summary (Last 24 hours) at 06/21/15 0924 Last data filed at 06/21/15 0330  Gross per 24 hour  Intake 3760.25 ml  Output   1325 ml  Net 2435.25 ml    General: NAD Cardiovascular: RRR, no murmurs Pulmonary: CTAB Abdomen: Benign. Non-tender, +BS, no guarding. +steristrips intact on RLQ, no significant drainage, no signficant erythema / steri-strips off on LLQ incision no significant drainage or erythema on LLQ GU: foley catheter in place - draining clear yellow urine Extremities: No erythema or cords, no calf tenderness, +warmth with normal peripheral pulses.  Labs: Results for orders placed or performed during the hospital encounter of 06/19/15 (from the past 24 hour(s))  CBC     Status: Abnormal   Collection Time: 06/20/15  7:58 PM  Result Value Ref Range   WBC 9.7 3.6 - 11.0 K/uL   RBC 3.87 3.80 - 5.20 MIL/uL   Hemoglobin 10.9 (L) 12.0 - 16.0 g/dL   HCT 69.6 (L) 29.5 - 28.4 %   MCV 86.7 80.0 - 100.0 fL   MCH 28.1 26.0 - 34.0 pg   MCHC 32.4 32.0 - 36.0 g/dL   RDW 13.2 (H) 44.0 - 10.2 %   Platelets 305 150 - 440 K/uL  Basic metabolic panel     Status: Abnormal   Collection Time: 06/20/15  7:58 PM  Result Value Ref Range   Sodium 138 135 - 145 mmol/L   Potassium 3.9 3.5 - 5.1 mmol/L   Chloride 108 101 - 111 mmol/L   CO2 25 22 - 32 mmol/L   Glucose, Bld 117 (H) 65 - 99 mg/dL   BUN 12 6 - 20 mg/dL   Creatinine, Ser 7.25 0.44 - 1.00 mg/dL   Calcium 8.1 (L) 8.9 - 10.3 mg/dL   GFR calc non Af Amer >60 >60 mL/min   GFR  calc Af Amer >60 >60 mL/min   Anion gap 5 5 - 15  CBC     Status: Abnormal   Collection Time: 06/21/15  5:03 AM  Result Value Ref Range   WBC 7.7 3.6 - 11.0 K/uL   RBC 3.86 3.80 - 5.20 MIL/uL   Hemoglobin 10.9 (L) 12.0 - 16.0 g/dL   HCT 36.6 (L) 44.0 - 34.7 %   MCV 86.9 80.0 - 100.0 fL   MCH 28.2 26.0 - 34.0 pg   MCHC 32.4 32.0 - 36.0 g/dL   RDW 42.5 (H) 95.6 - 38.7 %   Platelets 269 150 - 440 K/uL  Basic metabolic panel     Status: Abnormal   Collection Time: 06/21/15  5:03 AM  Result Value Ref Range   Sodium 140 135 - 145 mmol/L   Potassium 3.9 3.5 - 5.1 mmol/L   Chloride 111 101 - 111 mmol/L   CO2 25 22 - 32 mmol/L   Glucose, Bld 93 65 - 99 mg/dL   BUN 11 6 - 20 mg/dL   Creatinine, Ser 5.64 0.44 - 1.00 mg/dL   Calcium 8.1 (  L) 8.9 - 10.3 mg/dL   GFR calc non Af Amer >60 >60 mL/min   GFR calc Af Amer >60 >60 mL/min   Anion gap 4 (L) 5 - 15     Imaging: US Abdomen Complete  06/20/2015  CLINICAL DATA:  Postoperative hypotension, with decreased urinary output. Status post laparoscopic hysterectomy, with bladder injury. Initial encounter. EXAM: ABDOMEN ULTRASOUND COMPLETE COMPARISON:  None. FINDINGS: Gallbladder: Status post cholecystectomy.  No retained stones seen. Common bile duct: Diameter: 0.4 cm, within normal limits in caliber. Liver: No focal lesion identified. Within normal limits in parenchymal echogenicity. IVC: No abnormality visualized. Pancreas: Not visualized due to overlying bowel gas. Spleen: Size and appearance within normal limits. Right Kidney: Length: 10.2 cm. Echogenicity within normal limits. No mass or hydronephrosis visualized. Left Kidney: Length: 10.0 cm. Echogenicity within normal limits. There appears to be a 2.5 cm cyst at the interpole region of the left kidney. No hydronephrosis visualized. Abdominal aorta: No aneurysm visualized. Only partially characterized due to overlying bowel gas. Other findings: None. IMPRESSION: 1. No acute abnormality seen to  explain the patient's symptoms. 2. Small left renal cyst noted. Electronically Signed   By: Roanna Raider M.D.   On: 06/20/2015 18:27   US Pelvis Limited  06/20/2015  CLINICAL DATA:  50 year old female status post laparoscopic hysterectomy with urinary bladder injury during surgery. Diminished urine output. Initial encounter. EXAM: LIMITED ULTRASOUND OF PELVIS TECHNIQUE: Limited transabdominal ultrasound examination of the pelvis was performed. COMPARISON:  Abdomen ultrasound today reported separately. FINDINGS: Diminutive urinary bladder with Foley catheter balloon visible (image 9). No pelvic free fluid identified. Uterus appears to be surgically absent. Ovaries not identified. IMPRESSION: Foley catheter within the urinary bladder which appears decompressed. No pelvic free fluid identified. Electronically Signed   By: Odessa Fleming M.D.   On: 06/20/2015 18:26     Assessment   50 y.o. G2P2 POD#2 s/p TLH, BS, cystoscopy c/b bladder injury.  Foley in place for 5 days to allow for bladder healing. Doing well.   Plan   1. Discontinue IV today 2. Discharge home today - see orders from Dr. Tildon Husky on 06/20/15 3. Continue foley - switch to leg bag foley 4. F/U at [facility] on Wednesday with Dr. Tildon Husky for catheter removal, then for Post-op check on 2/3  Dr. Tildon Husky updated and aware and discussed plan of care  Carlean Jews, CNM

## 2015-06-21 NOTE — Progress Notes (Signed)
Discharge instructions provided.  Pt verbalizes understanding of all instructions and follow-up care.  Prescriptions and catheter supplies given.  Care of catheter instructed and pt verbalizes understanding.  Pt discharged to home at 1415 on 06/21/15 via wheelchair by Nursing Student. Reynold Bowen, RN 06/21/2015 3:12 PM

## 2015-06-23 LAB — SURGICAL PATHOLOGY

## 2016-07-12 ENCOUNTER — Encounter: Payer: Self-pay | Admitting: Emergency Medicine

## 2016-07-12 ENCOUNTER — Emergency Department
Admission: EM | Admit: 2016-07-12 | Discharge: 2016-07-12 | Disposition: A | Discharge status: Home / Self Care | Payer: BC Managed Care – PPO | Attending: Emergency Medicine | Admitting: Emergency Medicine

## 2016-07-12 DIAGNOSIS — Y929 Unspecified place or not applicable: Secondary | ICD-10-CM | POA: Insufficient documentation

## 2016-07-12 DIAGNOSIS — X501XXA Overexertion from prolonged static or awkward postures, initial encounter: Secondary | ICD-10-CM | POA: Diagnosis not present

## 2016-07-12 DIAGNOSIS — S838X2A Sprain of other specified parts of left knee, initial encounter: Secondary | ICD-10-CM | POA: Insufficient documentation

## 2016-07-12 DIAGNOSIS — Y999 Unspecified external cause status: Secondary | ICD-10-CM | POA: Diagnosis not present

## 2016-07-12 DIAGNOSIS — Y9389 Activity, other specified: Secondary | ICD-10-CM | POA: Diagnosis not present

## 2016-07-12 DIAGNOSIS — S8992XA Unspecified injury of left lower leg, initial encounter: Secondary | ICD-10-CM | POA: Diagnosis present

## 2016-07-12 MED ORDER — MELOXICAM 15 MG PO TABS: 15.0000 mg | ORAL_TABLET | Freq: Every day | ORAL | 0 refills | Status: AC

## 2016-07-12 MED ORDER — TRAMADOL HCL 50 MG PO TABS
50.0000 mg | ORAL_TABLET | Freq: Once | ORAL | Status: AC
  Administered 2016-07-12: 50 mg via ORAL
  Filled 2016-07-12: qty 1

## 2016-07-12 NOTE — ED Notes (Signed)
Pt reports that 2 weeks ago she popped her left knee - now she is having feeling of pins and needles in her left knee and into her calf - left knee is swollen

## 2016-07-12 NOTE — ED Provider Notes (Signed)
[facility] Emergency Department Provider Note  ____________________________________________  Time seen: Approximately 7:46 PM  I have reviewed the triage vital signs and the nursing notes.   HISTORY  Chief Complaint Knee Pain    HPI Brianna Silva is a 51 y.o. female that presents to the emergency department with left knee pain for 3 weeks. Patient states that she was turning and stepped funny and heard a very loud pop in her left knee. Patient states that she has had pain in the front of knee that radiates 1/3 of the way down shin. Patient states that she gets pain over knee when walking. Patient states it feels like pins and needles over knee and at top of the tibia. Knee feels swollen. Patient is taking ibuprofen for pain, which is not helping. No trauma. Patient denies fever, cough, shortness of breath, chest pain, palpitations, nausea, vomiting, abdominal pain, calf pain. Patient does not smoke, has not traveled recently, has not had recent surgery, not bedridden, no history of blood clots, no calf pain, no swelling.   History reviewed. No pertinent past medical history.  Patient Active Problem List   Diagnosis Date Noted  . S/P laparoscopic hysterectomy 06/19/2015    Past Surgical History:  Procedure Laterality Date  . BREAST REDUCTION SURGERY Bilateral 1999  . CESAREAN SECTION  1997  . CHOLECYSTECTOMY    . CYSTOSCOPY  06/19/2015   Procedure: CYSTOSCOPY;  Surgeon: Ala DachJohanna K Halfon, MD;  Location: [facility] ORS;  Service: Gynecology;;  . LAPAROSCOPIC HYSTERECTOMY Bilateral 06/19/2015   Procedure: HYSTERECTOMY TOTAL LAPAROSCOPIC/BILATERAL SALPINGECTOMY/LABIAL CYST EXCISION;  Surgeon: Ala DachJohanna K Halfon, MD;  Location: [facility] ORS;  Service: Gynecology;  Laterality: Bilateral;    Prior to Admission medications   Medication Sig Start Date End Date Taking? Authorizing Provider  Cyanocobalamin (VITAMIN B 12 PO) Take 1 tablet by mouth every morning.    Historical  Provider, MD  Ginger, Zingiber officinalis, (GINGER EXTRACT PO) Take 1 capsule by mouth 3 (three) times daily.    Historical Provider, MD  oxyCODONE (OXY IR/ROXICODONE) 5 MG immediate release tablet Take 2 tablets (10 mg total) by mouth every 4 (four) hours as needed for severe pain (1 tab for moderate pain. take with colace). 06/20/15   Ala DachJohanna K Halfon, MD  senna-docusate (SENOKOT-S) 8.6-50 MG tablet Take 1 tablet by mouth at bedtime. 06/20/15   Ala DachJohanna K Halfon, MD  simethicone (MYLICON) 80 MG chewable tablet Chew 1 tablet (80 mg total) by mouth 4 (four) times daily as needed for flatulence. 06/20/15   Ala DachJohanna K Halfon, MD    Allergies Other; Peanuts [peanut oil]; and Cinnamon  No family history on file.  Social History Social History  Substance Use Topics  . Smoking status: Never Smoker  . Smokeless tobacco: Never Used  . Alcohol use No     Review of Systems  Constitutional: No fever/chills ENT: No upper respiratory complaints. Cardiovascular: No chest pain. Respiratory: No cough. No SOB. Gastrointestinal: No abdominal pain.  No nausea, no vomiting.  Skin: Negative for rash, abrasions, lacerations. Neurological: Negative for headaches.   ____________________________________________   PHYSICAL EXAM:  VITAL SIGNS: ED Triage Vitals  Enc Vitals Group     BP 07/12/16 1739 (!) 200/76     Pulse Rate 07/12/16 1739 85     Resp 07/12/16 1739 18     Temp 07/12/16 1739 98.1 F (36.7 C)     Temp Source 07/12/16 1739 Oral     SpO2 07/12/16 1739 98 %  Weight 07/12/16 1737 250 lb (113.4 kg)     Height --      Head Circumference --      Peak Flow --      Pain Score 07/12/16 1737 7     Pain Loc --      Pain Edu? --      Excl. in GC? --      Constitutional: Alert and oriented. Well appearing and in no acute distress. Eyes: Conjunctivae are normal. PERRL. EOMI. Head: Atraumatic. ENT:      Ears:      Nose: No congestion/rhinnorhea.      Mouth/Throat: Mucous membranes are  moist.  Neck: No stridor.   Cardiovascular: Normal rate, regular rhythm.  Good peripheral circulation. Respiratory: Normal respiratory effort without tachypnea or retractions. Lungs CTAB. Good air entry to the bases with no decreased or absent breath sounds. Musculoskeletal: Full range of motion to all extremities. No gross deformities appreciated. Tenderness to palpation inferior portion of patella. Positive mcMurray, patella apprehension, apley grind. No effusion noted.  Negative anterior drawer, posterior drawer, valgus, varus.  No tenderness to palpation over shin or calf. Neurologic:  Normal speech and language. No gross focal neurologic deficits are appreciated.  Skin:  Skin is warm, dry and intact. No rash noted. Psychiatric: Mood and affect are normal. Speech and behavior are normal.    ____________________________________________   LABS (all labs ordered are listed, but only abnormal results are displayed)  Labs Reviewed - No data to display ____________________________________________  EKG   ____________________________________________  RADIOLOGY  No results found.  ____________________________________________    PROCEDURES  Procedure(s) performed:    Procedures    Medications  traMADol (ULTRAM) tablet 50 mg (not administered)     ____________________________________________   INITIAL IMPRESSION / ASSESSMENT AND PLAN / ED COURSE  Pertinent labs & imaging results that were available during my care of the patient were reviewed by me and considered in my medical decision making (see chart for details).  Review of the [place] CSRS was performed in accordance of the [facility] prior to dispensing any controlled drugs.     Patient's diagnosis is consistent with meniscal tear. Exam is reassuring.  Patient agreed that x-ray is unlikely to show anything. No indication of blood clot. Patient will be discharged home with prescriptions for Meloxicam. Patient is to follow  up with orthopedics as directed.   Blood pressure elevated in the emergency department and patient declined blood pressure medication. I explained to patient that it is my medical recommendation that I give her blood pressure medication. Patient states that she is not concerned about her blood pressure and that it is always elevated but she will let her PCP know. She states that she has taken blood pressure medication in the past but only takes it for 2 weeks and then quits. Education about strokes was provided. Patient is given ED precautions to return to the ED for any worsening or new symptoms.       ____________________________________________  FINAL CLINICAL IMPRESSION(S) / ED DIAGNOSES  Final diagnoses:  None      NEW MEDICATIONS STARTED DURING THIS VISIT:  New Prescriptions   No medications on file        This chart was dictated using voice recognition software/Dragon. Despite best efforts to proofread, errors can occur which can change the meaning. Any change was purely unintentional.    Enid Derry, PA-C 07/12/16 2113    Minna Antis, MD 07/12/16 [phone]

## 2016-07-12 NOTE — ED Triage Notes (Signed)
Pt states her left knee "popped" about two weeks ago and now having pain in her left lower leg.

## 2016-07-28 ENCOUNTER — Other Ambulatory Visit: Payer: Self-pay | Admitting: Orthopedic Surgery

## 2016-07-28 DIAGNOSIS — M25562 Pain in left knee: Secondary | ICD-10-CM

## 2016-07-28 DIAGNOSIS — M25362 Other instability, left knee: Secondary | ICD-10-CM

## 2016-07-28 DIAGNOSIS — M2392 Unspecified internal derangement of left knee: Secondary | ICD-10-CM

## 2016-08-11 ENCOUNTER — Ambulatory Visit
Admission: RE | Admit: 2016-08-11 | Discharge: 2016-08-11 | Disposition: A | Discharge status: Home / Self Care | Payer: BC Managed Care – PPO | Source: Ambulatory Visit | Attending: Orthopedic Surgery | Admitting: Orthopedic Surgery

## 2016-08-11 DIAGNOSIS — M25562 Pain in left knee: Secondary | ICD-10-CM

## 2016-08-11 DIAGNOSIS — M2392 Unspecified internal derangement of left knee: Secondary | ICD-10-CM

## 2016-08-11 DIAGNOSIS — M25362 Other instability, left knee: Secondary | ICD-10-CM | POA: Insufficient documentation

## 2016-08-11 DIAGNOSIS — M23342 Other meniscus derangements, anterior horn of lateral meniscus, left knee: Secondary | ICD-10-CM | POA: Insufficient documentation

## 2017-07-25 IMAGING — MR MR KNEE*L* W/O CM
6 series · 40 of 40 positions shown · non-contrast
Comparison: None.

CLINICAL DATA: The patient suffered a twisting injury of the right
knee with a pop 3 months ago. Continued pain.

EXAM:
MRI OF THE LEFT KNEE WITHOUT CONTRAST
TECHNIQUE: Multiplanar, multisequence MR imaging of the knee was performed. No
intravenous contrast was administered.

[Series 3: PD fat-sat · axial · 3.0mm · 0.35mm/px · z∈[-31,+85]mm · 8 of 36 slices shown (1 of 4)]
[im 1/36]
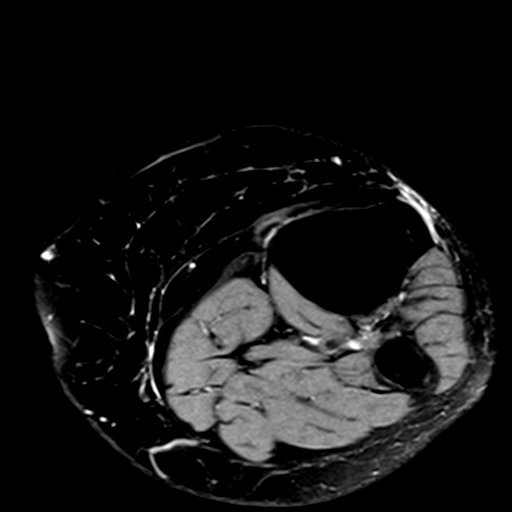
[im 6/36]
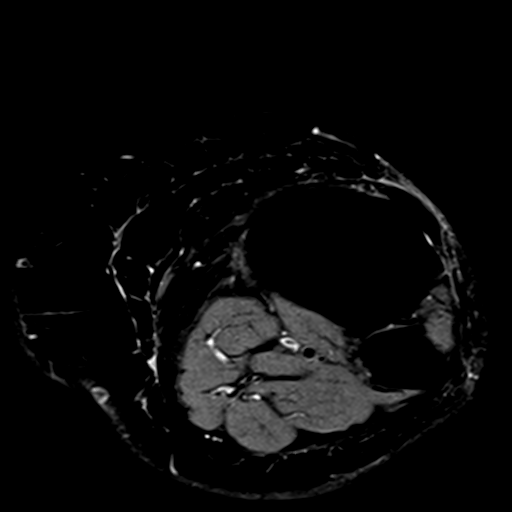
[im 11/36]
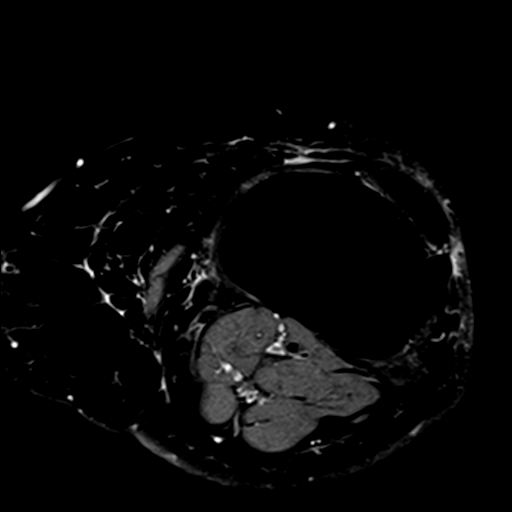
[im 16/36]
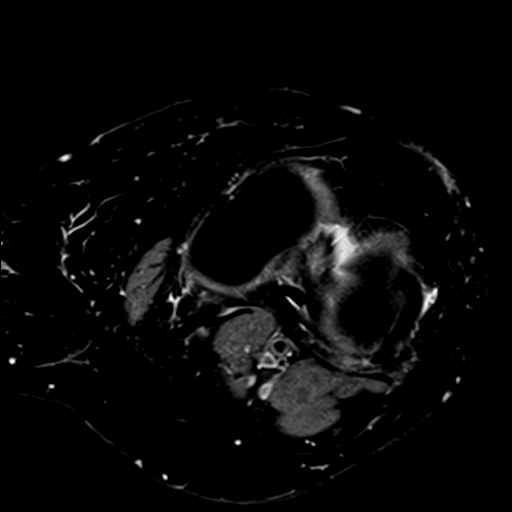
[im 21/36]
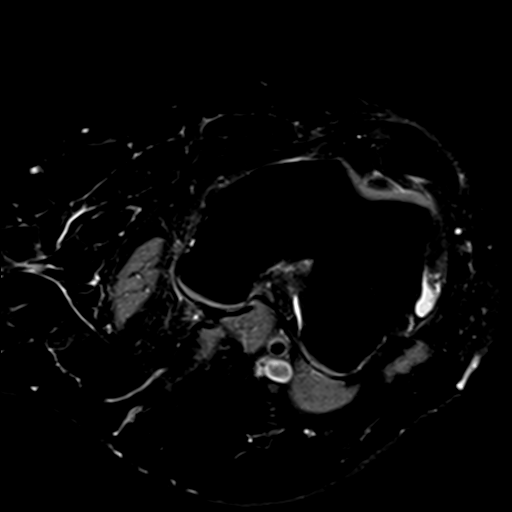
[im 26/36]
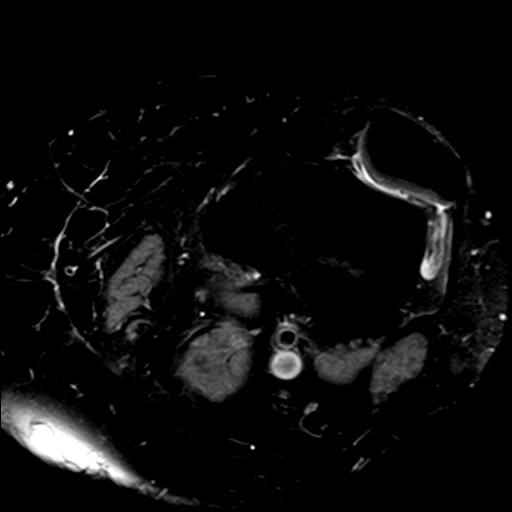
[im 31/36]
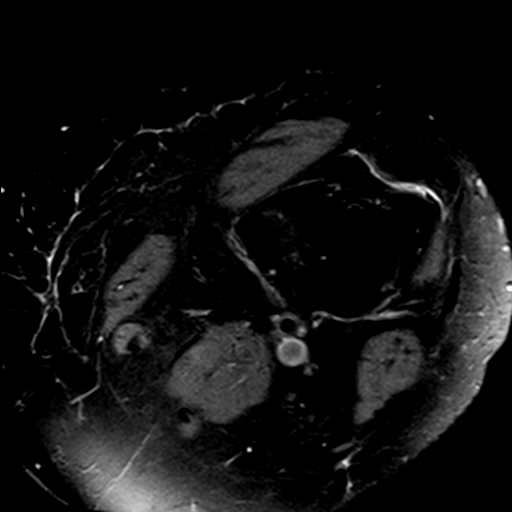
[im 36/36]
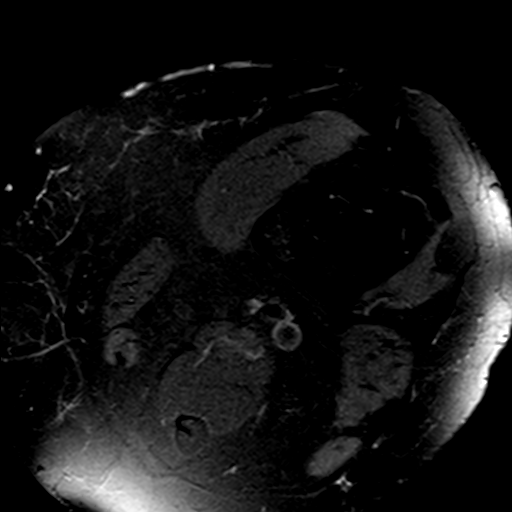

[Series 4: T1 · coronal · 3.0mm · 0.50mm/px · 7 of 32 slices shown]
[im 1/32]
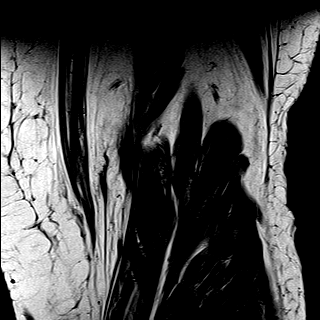
[im 6/32]
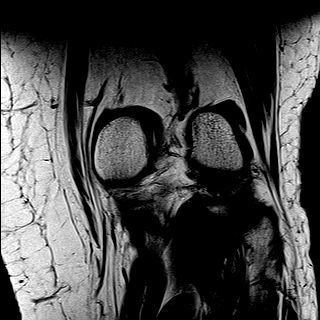
[im 11/32]
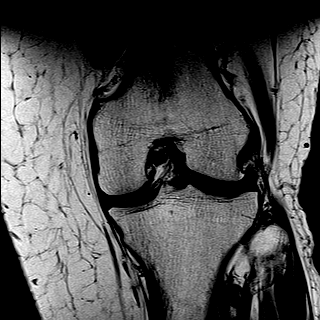
[im 16/32]
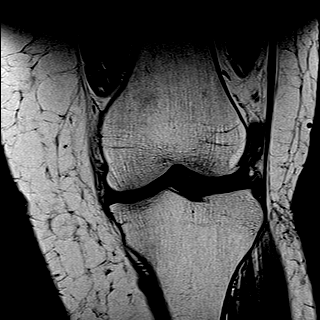
[im 21/32]
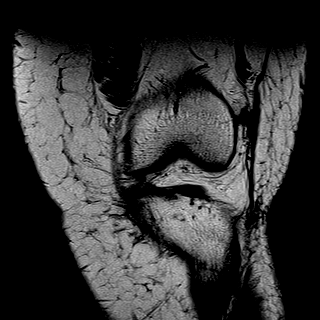
[im 26/32]
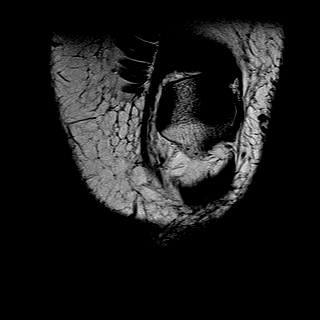
[im 32/32]
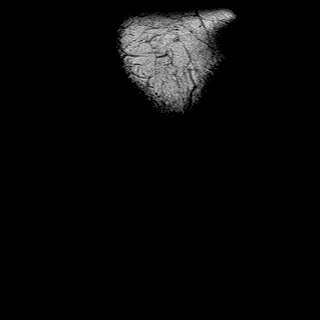

[Series 5: T2 fat-sat · coronal · 3.0mm · 0.50mm/px · 7 of 32 slices shown]
[im 1/32]
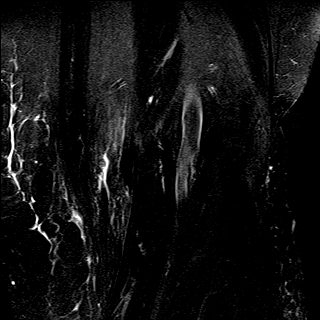
[im 6/32]
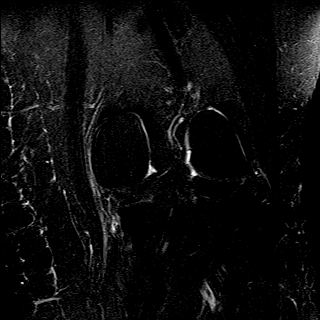
[im 11/32]
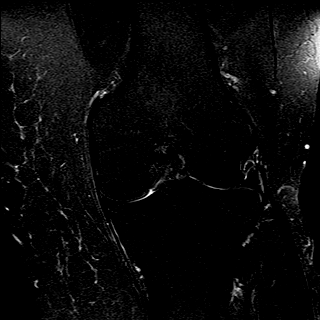
[im 16/32]
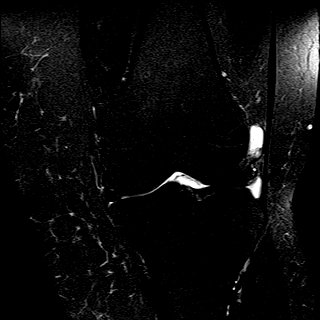
[im 21/32]
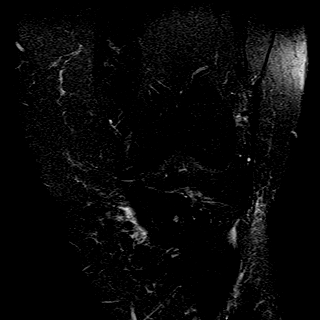
[im 26/32]
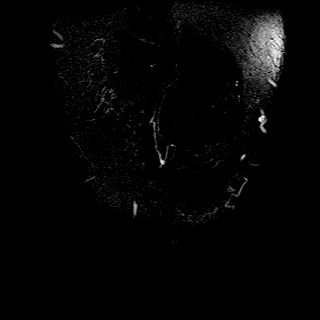
[im 32/32]
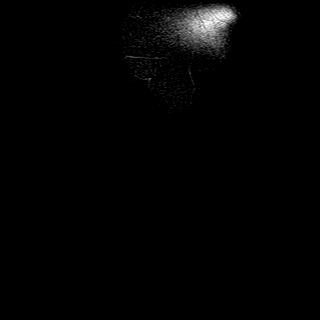

[Series 6: PD fat-sat · coronal · 3.0mm · 0.62mm/px · 7 of 32 slices shown (2 of 4)]
[im 1/32]
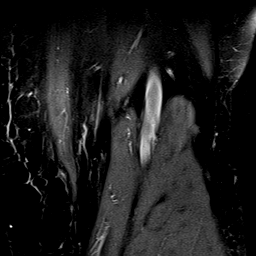
[im 6/32]
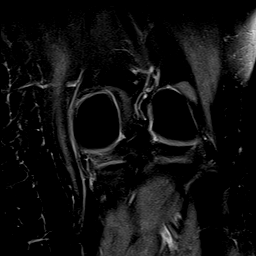
[im 11/32]
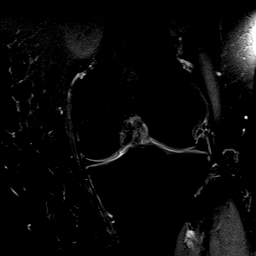
[im 16/32]
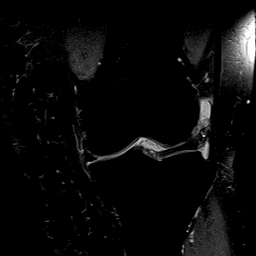
[im 21/32]
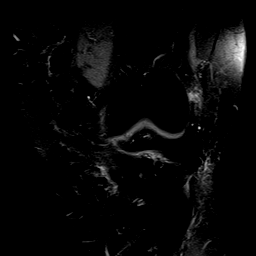
[im 26/32]
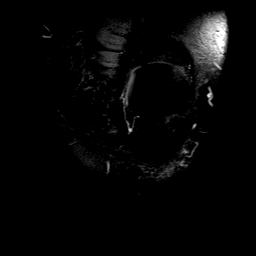
[im 32/32]
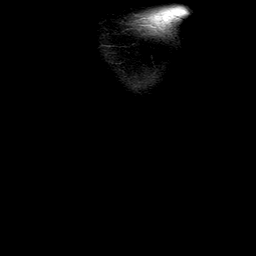

[Series 7: PD fat-sat · sagittal · 3.0mm · 0.62mm/px · 8 of 37 slices shown (3 of 4)]
[im 1/37]
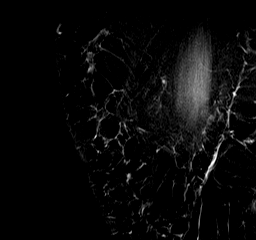
[im 6/37]
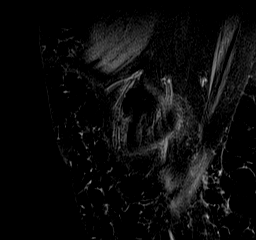
[im 11/37]
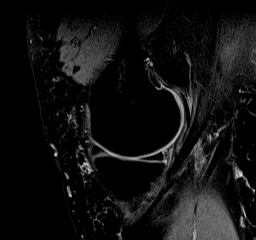
[im 16/37]
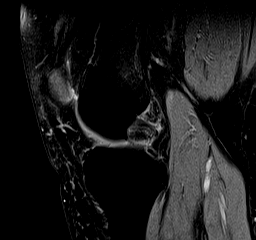
[im 21/37]
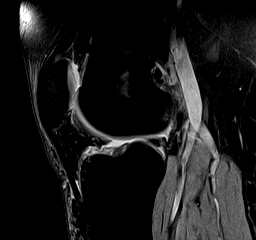
[im 26/37]
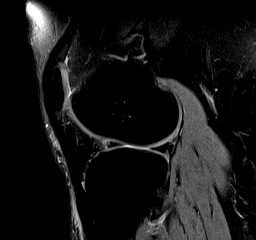
[im 31/37]
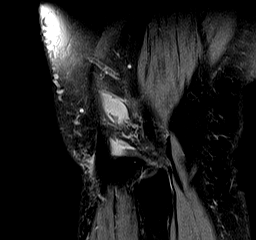
[im 37/37]
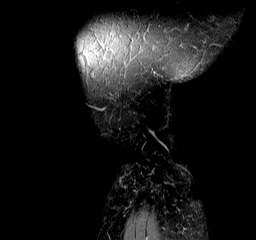

[Series 8: PD fat-sat · oblique · 2.0mm · 0.62mm/px · 3 of 14 slices shown (4 of 4)]
[im 1/14]
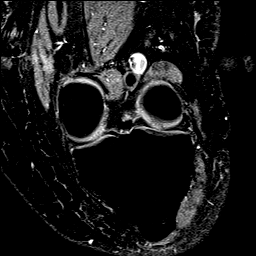
[im 7/14]
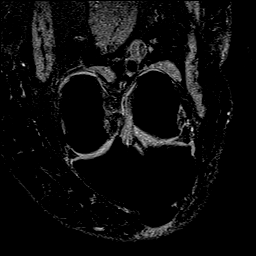
[im 14/14]
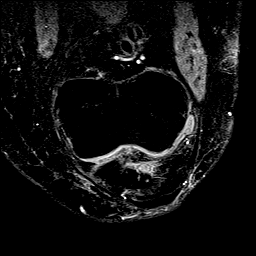

[40 of 40 positions shown; findings below may reference images not displayed]

FINDINGS: MENISCI

Medial meniscus:  Intact.

Lateral meniscus: Intact. Intrasubstance degenerative signal is seen
in the anterior aspect of the body.

LIGAMENTS

Cruciates:  Intact.

Collaterals:  Intact.

CARTILAGE

Patellofemoral:  Negative.

Medial:  Negative.

Lateral:  Negative.

Joint:  Very small effusion.

Popliteal Fossa:  No Baker's cyst.

Extensor Mechanism:  Intact.

Bones:  No fracture or worrisome lesion.

Other: None.
IMPRESSION: Degenerative signal anterior body of the lateral meniscus. The study
is otherwise unremarkable. Negative for meniscal or ligament tear.

## 2019-07-28 ENCOUNTER — Ambulatory Visit: Payer: BC Managed Care – PPO | Attending: Internal Medicine

## 2019-07-28 DIAGNOSIS — Z23 Encounter for immunization: Secondary | ICD-10-CM | POA: Insufficient documentation

## 2019-07-28 NOTE — Progress Notes (Signed)
   Covid-19 Vaccination Clinic  Name:  Brianna Silva    MRN: [id number] DOB: 14-Jul-1965  07/28/2019  [name] was observed post Covid-19 immunization for 15 minutes without incidence. She was provided with Vaccine Information Sheet and instruction to access the V-Safe system.   [name] was instructed to call 911 with any severe reactions post vaccine: Marland Kitchen Difficulty breathing  . Swelling of your face and throat  . A fast heartbeat  . A bad rash all over your body  . Dizziness and weakness    Immunizations Administered    Name Date Dose VIS Date Route   Pfizer COVID-19 Vaccine 07/28/2019 12:18 PM 0.3 mL 05/10/2019 Intramuscular   Manufacturer: [employer], [employer]   Lot: [id number]   NDC: [address]-0

## 2019-08-20 ENCOUNTER — Ambulatory Visit: Payer: BC Managed Care – PPO | Attending: Internal Medicine

## 2019-08-20 DIAGNOSIS — Z23 Encounter for immunization: Secondary | ICD-10-CM

## 2019-08-20 NOTE — Progress Notes (Signed)
   Covid-19 Vaccination Clinic  Name:  BRANTLEY NASER    MRN: [id number] DOB: [birth year]  08/20/2019  [name] was observed post Covid-19 immunization for 15 minutes without incident. She was provided with Vaccine Information Sheet and instruction to access the V-Safe system.   [name] was instructed to call 911 with any severe reactions post vaccine: Marland Kitchen Difficulty breathing  . Swelling of face and throat  . A fast heartbeat  . A bad rash all over body  . Dizziness and weakness   Immunizations Administered    Name Date Dose VIS Date Route   Pfizer COVID-19 Vaccine 08/20/2019  2:46 PM 0.3 mL 05/10/2019 Intramuscular   Manufacturer: [employer], [employer]   Lot: [id number]   NDC: [address]-3

## 2019-11-04 ENCOUNTER — Other Ambulatory Visit: Payer: Self-pay | Admitting: Student

## 2019-11-04 DIAGNOSIS — M222X2 Patellofemoral disorders, left knee: Secondary | ICD-10-CM

## 2019-11-04 DIAGNOSIS — M6752 Plica syndrome, left knee: Secondary | ICD-10-CM

## 2019-11-14 ENCOUNTER — Ambulatory Visit
Admission: RE | Admit: 2019-11-14 | Discharge: 2019-11-14 | Disposition: A | Discharge status: Home / Self Care | Payer: BC Managed Care – PPO | Source: Ambulatory Visit | Attending: Student | Admitting: Student

## 2019-11-14 ENCOUNTER — Other Ambulatory Visit: Payer: Self-pay

## 2019-11-14 DIAGNOSIS — M6752 Plica syndrome, left knee: Secondary | ICD-10-CM | POA: Insufficient documentation

## 2019-11-14 DIAGNOSIS — M222X2 Patellofemoral disorders, left knee: Secondary | ICD-10-CM | POA: Diagnosis present

## 2019-12-11 ENCOUNTER — Other Ambulatory Visit: Payer: Self-pay | Admitting: Surgery

## 2019-12-12 ENCOUNTER — Other Ambulatory Visit: Payer: Self-pay

## 2019-12-12 ENCOUNTER — Encounter
Admission: RE | Admit: 2019-12-12 | Discharge: 2019-12-12 | Disposition: A | Discharge status: Home / Self Care | Payer: BC Managed Care – PPO | Source: Ambulatory Visit | Attending: Surgery | Admitting: Surgery

## 2019-12-12 NOTE — Patient Instructions (Signed)
Your procedure is scheduled on: 12/19/19 Report to [place]. To find out your arrival time please call [phone] between 1PM - 3PM on 12/18/19.  Remember: Instructions that are not followed completely may result in serious medical risk, up to and including death, or upon the discretion of your surgeon and anesthesiologist your surgery may need to be rescheduled.     _X__ 1. Do not eat food after midnight the night before your procedure.                 No gum chewing or hard candies. You may drink clear liquids up to 2 hours                 before you are scheduled to arrive for your surgery- DO not drink clear                 liquids within 2 hours of the start of your surgery.                 Clear Liquids include:  water, apple juice without pulp, clear carbohydrate                 drink such as Clearfast or Gatorade, Black Coffee or Tea (Do not add                 anything to coffee or tea). Diabetics water only  __X__2.  On the morning of surgery brush your teeth with toothpaste and water, you                 may rinse your mouth with mouthwash if you wish.  Do not swallow any              toothpaste of mouthwash.     _X__ 3.  No Alcohol for 24 hours before or after surgery.   _X__ 4.  Do Not Smoke or use e-cigarettes For 24 Hours Prior to Your Surgery.                 Do not use any chewable tobacco products for at least 6 hours prior to                 surgery.  ____  5.  Bring all medications with you on the day of surgery if instructed.   __X__  6.  Notify your doctor if there is any change in your medical condition      (cold, fever, infections).     Do not wear jewelry, make-up, hairpins, clips or nail polish. Do not wear lotions, powders, or perfumes.  Do not shave 48 hours prior to surgery. Men may shave face and neck. Do not bring valuables to the hospital.    [facility] is not responsible for any belongings or  valuables.  Contacts, dentures/partials or body piercings may not be worn into surgery. Bring a case for your contacts, glasses or hearing aids, a denture cup will be supplied. Leave your suitcase in the car. After surgery it may be brought to your room. For patients admitted to the hospital, discharge time is determined by your treatment team.   Patients discharged the day of surgery will not be allowed to drive home.   Please read over the following fact sheets that you were given:   MRSA Information  __X__ Take these medicines the morning of surgery with A SIP OF WATER:  1. none  2.   3.   4.  5.  6.  ____ Fleet Enema (as directed)   __X__ Use CHG Soap/SAGE wipes as directed  ____ Use inhalers on the day of surgery  ____ Stop metformin/Janumet/Farxiga 2 days prior to surgery    ____ Take 1/2 of usual insulin dose the night before surgery. No insulin the morning          of surgery.   ____ Stop Blood Thinners Coumadin/Plavix/Xarelto/Pleta/Pradaxa/Eliquis/Effient/Aspirin  on   Or contact your Surgeon, Cardiologist or Medical Doctor regarding  ability to stop your blood thinners  __X__ Stop Anti-inflammatories 7 days before surgery such as Advil, Ibuprofen, Motrin,  BC or Goodies Powder, Naprosyn, Naproxen, Aleve, Aspirin    __X__ Stop all herbal supplements, fish oil or vitamin E until after surgery.    ____ Bring C-Pap to the hospital.

## 2019-12-17 ENCOUNTER — Other Ambulatory Visit: Payer: Self-pay

## 2019-12-17 ENCOUNTER — Other Ambulatory Visit
Admission: RE | Admit: 2019-12-17 | Discharge: 2019-12-17 | Disposition: A | Discharge status: Home / Self Care | Payer: BC Managed Care – PPO | Source: Ambulatory Visit | Attending: Surgery | Admitting: Surgery

## 2019-12-17 DIAGNOSIS — Z01812 Encounter for preprocedural laboratory examination: Secondary | ICD-10-CM | POA: Diagnosis not present

## 2019-12-17 DIAGNOSIS — Z20822 Contact with and (suspected) exposure to covid-19: Secondary | ICD-10-CM | POA: Diagnosis not present

## 2019-12-17 LAB — SARS CORONAVIRUS 2 (TAT 6-24 HRS): SARS Coronavirus 2: NEGATIVE

## 2019-12-17 LAB — BASIC METABOLIC PANEL
Anion gap: 7 (ref 5–15)
BUN: 13 mg/dL (ref 6–20)
CO2: 27 mmol/L (ref 22–32)
Calcium: 9 mg/dL (ref 8.9–10.3)
Chloride: 107 mmol/L (ref 98–111)
Creatinine, Ser: 0.79 mg/dL (ref 0.44–1.00)
GFR calc Af Amer: >60 mL/min (ref >60)
GFR calc non Af Amer: >60 mL/min (ref >60)
Glucose, Bld: 122 mg/dL — ABNORMAL HIGH (ref 70–99)
Potassium: 3.5 mmol/L (ref 3.5–5.1)
Sodium: 141 mmol/L (ref 135–145)

## 2019-12-19 ENCOUNTER — Ambulatory Visit: Payer: BC Managed Care – PPO | Admitting: Anesthesiology

## 2019-12-19 ENCOUNTER — Encounter
Admission: RE | Disposition: A | Discharge status: Home / Self Care | Payer: Self-pay | Source: Home / Self Care | Attending: Surgery

## 2019-12-19 ENCOUNTER — Encounter: Payer: Self-pay | Admitting: Surgery

## 2019-12-19 ENCOUNTER — Ambulatory Visit
Admission: RE | Admit: 2019-12-19 | Discharge: 2019-12-19 | Disposition: A | Discharge status: Home / Self Care | Payer: BC Managed Care – PPO | Attending: Surgery | Admitting: Surgery

## 2019-12-19 ENCOUNTER — Other Ambulatory Visit: Payer: Self-pay

## 2019-12-19 ENCOUNTER — Ambulatory Visit: Payer: BC Managed Care – PPO

## 2019-12-19 DIAGNOSIS — X58XXXA Exposure to other specified factors, initial encounter: Secondary | ICD-10-CM | POA: Insufficient documentation

## 2019-12-19 DIAGNOSIS — Z6841 Body Mass Index (BMI) 40.0 and over, adult: Secondary | ICD-10-CM | POA: Diagnosis not present

## 2019-12-19 DIAGNOSIS — M6752 Plica syndrome, left knee: Secondary | ICD-10-CM | POA: Insufficient documentation

## 2019-12-19 DIAGNOSIS — M1712 Unilateral primary osteoarthritis, left knee: Secondary | ICD-10-CM | POA: Insufficient documentation

## 2019-12-19 DIAGNOSIS — M25862 Other specified joint disorders, left knee: Secondary | ICD-10-CM | POA: Diagnosis not present

## 2019-12-19 DIAGNOSIS — M23242 Derangement of anterior horn of lateral meniscus due to old tear or injury, left knee: Secondary | ICD-10-CM | POA: Insufficient documentation

## 2019-12-19 DIAGNOSIS — M94262 Chondromalacia, left knee: Secondary | ICD-10-CM | POA: Insufficient documentation

## 2019-12-19 DIAGNOSIS — M23262 Derangement of other lateral meniscus due to old tear or injury, left knee: Secondary | ICD-10-CM | POA: Diagnosis not present

## 2019-12-19 DIAGNOSIS — M84362A Stress fracture, left tibia, initial encounter for fracture: Secondary | ICD-10-CM | POA: Insufficient documentation

## 2019-12-19 DIAGNOSIS — Z419 Encounter for procedure for purposes other than remedying health state, unspecified: Secondary | ICD-10-CM

## 2019-12-19 HISTORY — PX: KNEE ARTHROSCOPY: SHX127

## 2019-12-19 SURGERY — ARTHROSCOPY, KNEE
Anesthesia: General | Site: Knee | Laterality: Left

## 2019-12-19 MED ORDER — DEXAMETHASONE SODIUM PHOSPHATE 10 MG/ML IJ SOLN
INTRAMUSCULAR | Status: DC | PRN
  Administered 2019-12-19: 8 mg via INTRAVENOUS

## 2019-12-19 MED ORDER — EPHEDRINE 5 MG/ML INJ
INTRAVENOUS | Status: AC
  Filled 2019-12-19: qty 10

## 2019-12-19 MED ORDER — PROPOFOL 500 MG/50ML IV EMUL
INTRAVENOUS | Status: AC
  Filled 2019-12-19: qty 50

## 2019-12-19 MED ORDER — SUGAMMADEX SODIUM 500 MG/5ML IV SOLN
INTRAVENOUS | Status: DC | PRN
  Administered 2019-12-19: 400 mg via INTRAVENOUS

## 2019-12-19 MED ORDER — KETOROLAC TROMETHAMINE 30 MG/ML IJ SOLN: 30.0000 mg | Freq: Once | INTRAMUSCULAR | Status: DC | PRN

## 2019-12-19 MED ORDER — PHENYLEPHRINE HCL (PRESSORS) 10 MG/ML IV SOLN
INTRAVENOUS | Status: AC
  Filled 2019-12-19: qty 1

## 2019-12-19 MED ORDER — MIDAZOLAM HCL 2 MG/2ML IJ SOLN
INTRAMUSCULAR | Status: DC | PRN
  Administered 2019-12-19: 2 mg via INTRAVENOUS

## 2019-12-19 MED ORDER — PROMETHAZINE HCL 25 MG/ML IJ SOLN: 6.2500 mg | INTRAMUSCULAR | Status: DC | PRN

## 2019-12-19 MED ORDER — BUPIVACAINE HCL (PF) 0.5 % IJ SOLN
INTRAMUSCULAR | Status: AC
  Filled 2019-12-19: qty 10

## 2019-12-19 MED ORDER — KETAMINE HCL 10 MG/ML IJ SOLN
INTRAMUSCULAR | Status: DC | PRN
  Administered 2019-12-19: 20 mg via INTRAVENOUS
  Administered 2019-12-19: 10 mg via INTRAVENOUS

## 2019-12-19 MED ORDER — FENTANYL CITRATE (PF) 100 MCG/2ML IJ SOLN: 25.0000 ug | INTRAMUSCULAR | Status: DC | PRN

## 2019-12-19 MED ORDER — CEFAZOLIN SODIUM-DEXTROSE 2-4 GM/100ML-% IV SOLN
INTRAVENOUS | Status: AC
  Filled 2019-12-19: qty 100

## 2019-12-19 MED ORDER — FAMOTIDINE 20 MG PO TABS: 20.0000 mg | ORAL_TABLET | Freq: Once | ORAL | Status: AC

## 2019-12-19 MED ORDER — EPHEDRINE SULFATE 50 MG/ML IJ SOLN
INTRAMUSCULAR | Status: DC | PRN
  Administered 2019-12-19: 10 mg via INTRAVENOUS

## 2019-12-19 MED ORDER — DROPERIDOL 2.5 MG/ML IJ SOLN
0.6250 mg | Freq: Once | INTRAMUSCULAR | Status: DC | PRN
  Filled 2019-12-19: qty 2

## 2019-12-19 MED ORDER — CHLORHEXIDINE GLUCONATE 0.12 % MT SOLN: 15.0000 mL | Freq: Once | OROMUCOSAL | Status: AC

## 2019-12-19 MED ORDER — FENTANYL CITRATE (PF) 100 MCG/2ML IJ SOLN
INTRAMUSCULAR | Status: DC | PRN
  Administered 2019-12-19 (×2): 25 ug via INTRAVENOUS

## 2019-12-19 MED ORDER — FENTANYL CITRATE (PF) 100 MCG/2ML IJ SOLN
INTRAMUSCULAR | Status: AC
  Filled 2019-12-19: qty 2

## 2019-12-19 MED ORDER — MIDAZOLAM HCL 2 MG/2ML IJ SOLN
INTRAMUSCULAR | Status: AC
  Filled 2019-12-19: qty 2

## 2019-12-19 MED ORDER — ACETAMINOPHEN 160 MG/5ML PO SOLN
325.0000 mg | ORAL | Status: DC | PRN
  Filled 2019-12-19: qty 20.3

## 2019-12-19 MED ORDER — ORAL CARE MOUTH RINSE: 15.0000 mL | Freq: Once | OROMUCOSAL | Status: AC

## 2019-12-19 MED ORDER — DEXAMETHASONE SODIUM PHOSPHATE 10 MG/ML IJ SOLN
INTRAMUSCULAR | Status: AC
  Filled 2019-12-19: qty 1

## 2019-12-19 MED ORDER — DEXMEDETOMIDINE HCL 200 MCG/2ML IV SOLN
INTRAVENOUS | Status: DC | PRN
  Administered 2019-12-19: 8 ug via INTRAVENOUS
  Administered 2019-12-19: 12 ug via INTRAVENOUS

## 2019-12-19 MED ORDER — LIDOCAINE HCL (PF) 1 % IJ SOLN
INTRAMUSCULAR | Status: AC
  Filled 2019-12-19: qty 30

## 2019-12-19 MED ORDER — SUGAMMADEX SODIUM 500 MG/5ML IV SOLN
INTRAVENOUS | Status: AC
  Filled 2019-12-19: qty 5

## 2019-12-19 MED ORDER — SODIUM CHLORIDE (PF) 0.9 % IJ SOLN
INTRAMUSCULAR | Status: AC
  Filled 2019-12-19: qty 10

## 2019-12-19 MED ORDER — FAMOTIDINE 20 MG PO TABS
ORAL_TABLET | ORAL | Status: AC
  Administered 2019-12-19: 20 mg via ORAL
  Filled 2019-12-19: qty 1

## 2019-12-19 MED ORDER — ROCURONIUM BROMIDE 10 MG/ML (PF) SYRINGE
PREFILLED_SYRINGE | INTRAVENOUS | Status: AC
  Filled 2019-12-19: qty 10

## 2019-12-19 MED ORDER — HYDROCODONE-ACETAMINOPHEN 5-325 MG PO TABS: 1.0000 | ORAL_TABLET | Freq: Four times a day (QID) | ORAL | 0 refills | Status: DC | PRN

## 2019-12-19 MED ORDER — LIDOCAINE HCL 1 % IJ SOLN
INTRAMUSCULAR | Status: DC | PRN
  Administered 2019-12-19: 30 mL

## 2019-12-19 MED ORDER — CHLORHEXIDINE GLUCONATE 0.12 % MT SOLN
OROMUCOSAL | Status: AC
  Administered 2019-12-19: 15 mL via OROMUCOSAL
  Filled 2019-12-19: qty 15

## 2019-12-19 MED ORDER — CEFAZOLIN SODIUM-DEXTROSE 2-4 GM/100ML-% IV SOLN
2.0000 g | INTRAVENOUS | Status: AC
  Administered 2019-12-19: 2 g via INTRAVENOUS

## 2019-12-19 MED ORDER — DEXMEDETOMIDINE HCL IN NACL 80 MCG/20ML IV SOLN
INTRAVENOUS | Status: AC
  Filled 2019-12-19: qty 20

## 2019-12-19 MED ORDER — ROCURONIUM BROMIDE 100 MG/10ML IV SOLN
INTRAVENOUS | Status: DC | PRN
  Administered 2019-12-19: 50 mg via INTRAVENOUS

## 2019-12-19 MED ORDER — HYDROCODONE-ACETAMINOPHEN 7.5-325 MG PO TABS: 1.0000 | ORAL_TABLET | Freq: Once | ORAL | Status: DC | PRN

## 2019-12-19 MED ORDER — BUPIVACAINE-EPINEPHRINE (PF) 0.5% -1:200000 IJ SOLN
INTRAMUSCULAR | Status: AC
  Filled 2019-12-19: qty 60

## 2019-12-19 MED ORDER — ONDANSETRON HCL 4 MG/2ML IJ SOLN
INTRAMUSCULAR | Status: DC | PRN
  Administered 2019-12-19: 4 mg via INTRAVENOUS

## 2019-12-19 MED ORDER — ACETAMINOPHEN 325 MG PO TABS: 325.0000 mg | ORAL_TABLET | ORAL | Status: DC | PRN

## 2019-12-19 MED ORDER — BUPIVACAINE-EPINEPHRINE (PF) 0.5% -1:200000 IJ SOLN
INTRAMUSCULAR | Status: DC | PRN
  Administered 2019-12-19 (×2): 30 mL

## 2019-12-19 MED ORDER — ONDANSETRON HCL 4 MG/2ML IJ SOLN
INTRAMUSCULAR | Status: AC
  Filled 2019-12-19: qty 2

## 2019-12-19 MED ORDER — ACETAMINOPHEN 10 MG/ML IV SOLN
INTRAVENOUS | Status: DC | PRN
  Administered 2019-12-19: 1000 mg via INTRAVENOUS

## 2019-12-19 MED ORDER — KETAMINE HCL 50 MG/ML IJ SOLN
INTRAMUSCULAR | Status: AC
  Filled 2019-12-19: qty 10

## 2019-12-19 MED ORDER — LIDOCAINE HCL (CARDIAC) PF 100 MG/5ML IV SOSY
PREFILLED_SYRINGE | INTRAVENOUS | Status: DC | PRN
  Administered 2019-12-19: 100 mg via INTRAVENOUS

## 2019-12-19 MED ORDER — LACTATED RINGERS IV SOLN: INTRAVENOUS | Status: DC

## 2019-12-19 MED ORDER — PROPOFOL 10 MG/ML IV BOLUS
INTRAVENOUS | Status: DC | PRN
  Administered 2019-12-19: 200 mg via INTRAVENOUS
  Administered 2019-12-19: 20 mg via INTRAVENOUS

## 2019-12-19 SURGICAL SUPPLY — 39 items
APL PRP STRL LF DISP 70% ISPRP (MISCELLANEOUS) ×1
BAG COUNTER SPONGE EZ (MISCELLANEOUS) IMPLANT
BAG SPNG 4X4 CLR HAZ (MISCELLANEOUS)
BLADE FULL RADIUS 3.5 (BLADE) ×2 IMPLANT
BLADE SHAVER 4.5X7 STR FR (MISCELLANEOUS) ×2 IMPLANT
BNDG ELASTIC 6X5.8 VLCR STR LF (GAUZE/BANDAGES/DRESSINGS) ×2 IMPLANT
CHLORAPREP W/TINT 26 (MISCELLANEOUS) ×2 IMPLANT
COVER WAND RF STERILE (DRAPES) ×2 IMPLANT
CUFF TOURN SGL QUICK 24 (TOURNIQUET CUFF)
CUFF TOURN SGL QUICK 30 (TOURNIQUET CUFF)
CUFF TRNQT CYL 24X4X16.5-23 (TOURNIQUET CUFF) IMPLANT
CUFF TRNQT CYL 30X4X21-28X (TOURNIQUET CUFF) IMPLANT
DRAPE IMP U-DRAPE 54X76 (DRAPES) ×2 IMPLANT
ELECT REM PT RETURN 9FT ADLT (ELECTROSURGICAL) ×2
ELECTRODE REM PT RTRN 9FT ADLT (ELECTROSURGICAL) ×1 IMPLANT
GAUZE SPONGE 4X4 12PLY STRL (GAUZE/BANDAGES/DRESSINGS) ×2 IMPLANT
GLOVE BIO SURGEON STRL SZ8 (GLOVE) ×4 IMPLANT
GLOVE BIOGEL M 7.0 STRL (GLOVE) ×4 IMPLANT
GLOVE BIOGEL PI IND STRL 7.5 (GLOVE) ×1 IMPLANT
GLOVE BIOGEL PI INDICATOR 7.5 (GLOVE) ×1
GLOVE INDICATOR 8.0 STRL GRN (GLOVE) ×2 IMPLANT
GOWN STRL REUS W/ TWL LRG LVL3 (GOWN DISPOSABLE) ×1 IMPLANT
GOWN STRL REUS W/ TWL XL LVL3 (GOWN DISPOSABLE) ×2 IMPLANT
GOWN STRL REUS W/TWL LRG LVL3 (GOWN DISPOSABLE) ×2
GOWN STRL REUS W/TWL XL LVL3 (GOWN DISPOSABLE) ×4
IV LACTATED RINGER IRRG 3000ML (IV SOLUTION) ×2
IV LR IRRIG 3000ML ARTHROMATIC (IV SOLUTION) ×1 IMPLANT
KIT ACCUFILL 5CC (Knees) ×1 IMPLANT
KIT KNEE SCP 414.502 (Knees) ×2 IMPLANT
KIT TURNOVER KIT A (KITS) ×2 IMPLANT
MANIFOLD NEPTUNE II (INSTRUMENTS) ×2 IMPLANT
NEEDLE HYPO 21X1.5 SAFETY (NEEDLE) ×2 IMPLANT
PACK KNEE ARTHRO (MISCELLANEOUS) ×2 IMPLANT
PENCIL ELECTRO HAND CTR (MISCELLANEOUS) ×2 IMPLANT
SUT PROLENE 4 0 PS 2 18 (SUTURE) ×2 IMPLANT
SUT TICRON COATED BLUE 2 0 30 (SUTURE) IMPLANT
SYR 50ML LL SCALE MARK (SYRINGE) ×2 IMPLANT
TUBING ARTHRO INFLOW-ONLY STRL (TUBING) ×2 IMPLANT
WAND WEREWOLF FLOW 90D (MISCELLANEOUS) ×2 IMPLANT

## 2019-12-19 NOTE — Op Note (Signed)
12/19/2019  9:37 AM  Patient:   Brianna Silva  Pre-Op Diagnosis:   Patellofemoral syndrome with probable symptomatic plica, lateral meniscus tear, and subchondral stress fracture of lateral tibial plateau, left knee.  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  Procedure:   Arthroscopic partial lateral meniscectomy, abrasion chondroplasty of grade 3 chondromalacial changes of femoral trochlea and lateral tibial plateau, debridement of symptomatic plica, and subchondroplasty of subchondral stress fracture of lateral tibial plateau, left knee.  Surgeon:   Maryagnes Amos, MD  Assistant:   Volanda Napoleon, PA-S  Anesthesia:   GET  Findings:   As above. There was a focal area of grade 3 chondromalacial changes involving the lateral portion of the weightbearing portion of the lateral femoral condyle measuring approximately 1 x 2 cm. There was a small area of focal grade 2-3 chondromalacial changes involving the medial edge of the medial femoral condyle, consistent with a "kissing lesion". There was an area of grade III chondromalacia involving the femoral trochlea measuring approximately 2 x 2.5 cm. Finally, there were focal grade 2 chondromalacial changes involving the central ridge of the patella, and grade 1 chondromalacial changes involving the lateral tibial plateau. The medial meniscus was in satisfactory condition, as were the anterior and posterior cruciate ligaments.   Complications:   None.  EBL:   2 cc.  Total fluids:   800 cc of crystalloid.  Tourniquet time:   None  Drains:   None  Closure:   4-0 Prolene interrupted sutures.  Brief clinical note:   The patient is a 54 year old female with a 1+ year history of anterior left knee pain. Her symptoms have progressed despite medications, activity modification, physical  therapy, etc. Her history and examination are consistent with patellofemoral syndrome with a probable symptomatic medial shelf plica. Her preoperative MRI scan also demonstrated an anterior horn lateral meniscus tear as well as an insufficiency fracture involving the lateral tibial plateau. The patient presents at this time for arthroscopy, debridement of the presumed symptomatic medial plica, possible lateral release, subchondroplasty of the lateral tibial plateau, and partial lateral meniscectomy.  Procedure:   The patient was brought into the operating room and lain in the supine position. After adequate general endotracheal intubation and anesthesia was obtained, a timeout was performed to verify the appropriate side. The patient's left knee was injected sterilely using a solution of 30 cc of 1% lidocaine and 30 cc of 0.5% Sensorcaine with epinephrine. The left lower extremity was prepped with ChloraPrep solution before being draped sterilely. Preoperative antibiotics were administered. The expected portal sites were injected with 0.5% Sensorcaine with epinephrine before the camera was placed in the anterolateral portal and instrumentation performed through the anteromedial portal.   The subchondroplasty was performed first.  Under fluoroscopic guidance, the trocar was inserted into the appropriate portion of the lateral tibial plateau.  After verifying its position in AP and lateral projections using FluoroScan imaging, the calcium phosphate paste was injected into the area, rotating the needle at 90 degree increments to ensure good fill of the area.  Care was taken to avoid overfilling the area.  After waiting 8 minutes, the trocar was removed and the adequacy of stress fracture fill confirmed fluoroscopically in AP and lateral projections.  There did not appear to be any extravasation of the calcium phosphate paste either into the joint or out of the lateral cortex.  Next, the arthroscopic portion of  the procedure was begun. The knee was sequentially examined beginning in the suprapatellar pouch, then progressing to the patellofemoral space, the medial gutter and compartment, the notch, and finally the lateral compartment and gutter. The findings were as described above. Abundant reactive synovial tissues anteriorly were debrided using the full-radius resector in order to improve visualization. This exposed a symptomatic medial shelf plica which was debrided using the full-radius resector. The area of degenerative tearing involving the anterior horn of the lateral meniscus was debrided back to stable margins using the full-radius resector. A second smaller area of degenerative fraying involving the posterior lateral portion of the lateral meniscus also was debrided back to stable margins using the full-radius resector. Subsequent probing of the meniscus demonstrated excellent stability. The areas of grade 3 chondromalacial changes were debrided back to stable margins before the margins were lightly "annealed" using the ArthroCare wand set at the lowest setting.  The instruments were removed from the joint after suctioning the excess fluid.   The portal sites and subchondroplasty site were closed using 4-0 Prolene interrupted sutures before a sterile bulky dressing was applied to the knee. The patient was then awakened, extubated, and returned to the recovery room in satisfactory condition after tolerating the procedure well.

## 2019-12-19 NOTE — Anesthesia Postprocedure Evaluation (Signed)
Anesthesia Post Note  Patient: Brianna Silva  Procedure(s) Performed: Arthroscopic partial lateral meniscectomy, abrasion chondroplasty of grade 3 chondromalacial changes of femoral trochlea and lateral tibial plateau, debridement of symptomatic plica, and subchondroplasty of subchondral stress fracture of lateral tibial plateau, left knee (Left Knee)  Patient location during evaluation: PACU Anesthesia Type: General Level of consciousness: awake and alert Pain management: pain level controlled Vital Signs Assessment: post-procedure vital signs reviewed and stable Respiratory status: spontaneous breathing, nonlabored ventilation and respiratory function stable Cardiovascular status: blood pressure returned to baseline and stable Postop Assessment: no apparent nausea or vomiting Anesthetic complications: no   No complications documented.   Last Vitals:  Vitals:   12/19/19 1025 12/19/19 1033  BP: (!) 158/87 (!) 156/88  Pulse: 81 82  Resp: 21 20  Temp:  (!) 36.1 C  SpO2: 94% 94%    Last Pain:  Vitals:   12/19/19 1033  TempSrc: Temporal  PainSc: 0-No pain                 Christia Reading

## 2019-12-19 NOTE — Anesthesia Procedure Notes (Signed)
Procedure Name: Intubation Date/Time: 12/19/2019 7:44 AM Performed by: Lynden Oxford, CRNA Pre-anesthesia Checklist: Patient identified, Emergency Drugs available, Suction available and Patient being monitored Patient Re-evaluated:Patient Re-evaluated prior to induction Oxygen Delivery Method: Circle system utilized Preoxygenation: Pre-oxygenation with 100% oxygen Induction Type: IV induction Ventilation: Mask ventilation without difficulty Laryngoscope Size: McGraph and 3 Grade View: Grade I Tube type: Oral Tube size: 7.5 mm Number of attempts: 1 Airway Equipment and Method: Stylet,  Oral airway and Video-laryngoscopy Placement Confirmation: ETT inserted through vocal cords under direct vision,  positive ETCO2 and breath sounds checked- equal and bilateral Secured at: 21 cm Tube secured with: Tape Dental Injury: Teeth and Oropharynx as per pre-operative assessment  Difficulty Due To: Difficulty was anticipated Future Recommendations: Recommend- induction with short-acting agent, and alternative techniques readily available

## 2019-12-19 NOTE — Discharge Instructions (Addendum)
AMBULATORY SURGERY  DISCHARGE INSTRUCTIONS   1) The drugs that you were given will stay in your system until tomorrow so for the next 24 hours you should not:  A) Drive an automobile B) Make any legal decisions C) Drink any alcoholic beverage   2) You may resume regular meals tomorrow.  Today it is better to start with liquids and gradually work up to solid foods.  You may eat anything you prefer, but it is better to start with liquids, then soup and crackers, and gradually work up to solid foods.   3) Please notify your doctor immediately if you have any unusual bleeding, trouble breathing, redness and pain at the surgery site, drainage, fever, or pain not relieved by medication.    4) Additional Instructions:        Please contact your physician with any problems or Same Day Surgery at [phone], Monday through Friday 6 am to 4 pm, or [place] [facility] number at [phone].Orthopedic discharge instructions: Keep dressing dry and intact.  May shower after dressing changed on post-op day #4 (Sunday).  Cover sutures with Band-Aids after drying off. Apply ice frequently to knee. Take meloxicam 15 mg daily OR ibuprofen 600-800 mg TID with meals for 7-10 days, then as necessary. Take pain medication as prescribed or ES Tylenol when needed.  May weight-bear as tolerated - use crutches or walker as needed. Follow-up in 10-14 days or as scheduled.

## 2019-12-19 NOTE — Transfer of Care (Signed)
Immediate Anesthesia Transfer of Care Note  Patient: Brianna Silva  Procedure(s) Performed: LEFT KNEE ARTHROSCOPY WITH DEBRIDEMENT, EXCISION OF SYMPTOMATIC PLICA, POSSIBLE LATERAL RELEASE, AND SUBCHONDROPLASTY OF TIBIAL STRESS FRACTURE. (Left Knee)  Patient Location: PACU  Anesthesia Type:General  Level of Consciousness: drowsy  Airway & Oxygen Therapy: Patient Spontanous Breathing and Patient connected to face mask oxygen  Post-op Assessment: Report given to RN and Post -op Vital signs reviewed and stable  Post vital signs: Reviewed and stable  Last Vitals:  Vitals Value Taken Time  BP 157/88   Temp 36.6 C   Pulse 85   Resp 12   SpO2 97     Last Pain:  Vitals:   12/19/19 0620  TempSrc: Temporal  PainSc: 0-No pain         Complications: No complications documented.

## 2019-12-19 NOTE — Anesthesia Preprocedure Evaluation (Addendum)
Anesthesia Evaluation  Patient identified by MRN, date of birth, ID band Patient awake    Reviewed: Allergy & Precautions, H&P , NPO status , reviewed documented beta blocker date and time   Airway Mallampati: II  TM Distance: >3 FB Neck ROM: full    Dental  (+) Teeth Intact   Pulmonary neg sleep apnea,    Pulmonary exam normal        Cardiovascular Normal cardiovascular exam     Neuro/Psych    GI/Hepatic neg GERD  ,  Endo/Other  Morbid obesity  Renal/GU      Musculoskeletal   Abdominal   Peds  Hematology   Anesthesia Other Findings History reviewed. No pertinent past medical history. Past Surgical History: 1999: BREAST REDUCTION SURGERY; Bilateral 1997: CESAREAN SECTION No date: CHOLECYSTECTOMY 06/19/2015: CYSTOSCOPY     Comment:  Procedure: CYSTOSCOPY;  Surgeon: Ala Dach, MD;                Location: [facility];  Service: Gynecology;; 06/19/2015: LAPAROSCOPIC HYSTERECTOMY; Bilateral     Comment:  Procedure: HYSTERECTOMY TOTAL LAPAROSCOPIC/BILATERAL               SALPINGECTOMY/LABIAL CYST EXCISION;  Surgeon: Ala Dach, MD;  Location: [facility];  Service: Gynecology;                Laterality: Bilateral; BMI    Body Mass Index: 48.42 kg/m     Reproductive/Obstetrics                            Anesthesia Physical Anesthesia Plan  ASA: III  Anesthesia Plan: General   Post-op Pain Management:    Induction: Intravenous  PONV Risk Score and Plan: Ondansetron and Treatment may vary due to age or medical condition  Airway Management Planned: Oral ETT  Additional Equipment:   Intra-op Plan:   Post-operative Plan: Extubation in OR  Informed Consent: I have reviewed the patients History and Physical, chart, labs and discussed the procedure including the risks, benefits and alternatives for the proposed anesthesia with the patient or authorized  representative who has indicated his/her understanding and acceptance.     Dental Advisory Given  Plan Discussed with: CRNA  Anesthesia Plan Comments:         Anesthesia Quick Evaluation
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
## 2019-12-20 ENCOUNTER — Encounter: Payer: Self-pay | Admitting: Surgery

## 2020-09-30 ENCOUNTER — Other Ambulatory Visit: Payer: Self-pay

## 2020-09-30 DIAGNOSIS — R519 Headache, unspecified: Secondary | ICD-10-CM | POA: Insufficient documentation

## 2020-09-30 DIAGNOSIS — Y92410 Unspecified street and highway as the place of occurrence of the external cause: Secondary | ICD-10-CM | POA: Insufficient documentation

## 2020-09-30 DIAGNOSIS — Z5321 Procedure and treatment not carried out due to patient leaving prior to being seen by health care provider: Secondary | ICD-10-CM | POA: Insufficient documentation

## 2020-09-30 DIAGNOSIS — M542 Cervicalgia: Secondary | ICD-10-CM | POA: Insufficient documentation

## 2020-09-30 DIAGNOSIS — M549 Dorsalgia, unspecified: Secondary | ICD-10-CM | POA: Insufficient documentation
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
## 2020-10-01 ENCOUNTER — Emergency Department
Admission: EM | Admit: 2020-10-01 | Discharge: 2020-10-01 | Disposition: A | Discharge status: Home / Self Care | Payer: BC Managed Care – PPO | Attending: Emergency Medicine | Admitting: Emergency Medicine

## 2021-05-19 ENCOUNTER — Encounter: Payer: Self-pay | Admitting: Internal Medicine

## 2021-05-19 ENCOUNTER — Other Ambulatory Visit: Payer: Self-pay

## 2021-05-19 ENCOUNTER — Ambulatory Visit: Payer: BC Managed Care – PPO | Admitting: Internal Medicine

## 2021-05-19 VITALS — BP 160/110 | HR 92 | Temp 97.6°F | Ht 65.2 in | Wt 292.6 lb

## 2021-05-19 DIAGNOSIS — Z1329 Encounter for screening for other suspected endocrine disorder: Secondary | ICD-10-CM | POA: Diagnosis not present

## 2021-05-19 DIAGNOSIS — E559 Vitamin D deficiency, unspecified: Secondary | ICD-10-CM

## 2021-05-19 DIAGNOSIS — Z0184 Encounter for antibody response examination: Secondary | ICD-10-CM

## 2021-05-19 DIAGNOSIS — Z1389 Encounter for screening for other disorder: Secondary | ICD-10-CM

## 2021-05-19 DIAGNOSIS — I10 Essential (primary) hypertension: Secondary | ICD-10-CM | POA: Diagnosis not present

## 2021-05-19 DIAGNOSIS — Z1231 Encounter for screening mammogram for malignant neoplasm of breast: Secondary | ICD-10-CM

## 2021-05-19 DIAGNOSIS — R739 Hyperglycemia, unspecified: Secondary | ICD-10-CM

## 2021-05-19 MED ORDER — BLOOD PRESSURE KIT: 1.0000 | PACK | Freq: Every day | 0 refills | Status: AC

## 2021-05-19 MED ORDER — LOSARTAN POTASSIUM-HCTZ 50-12.5 MG PO TABS: ORAL_TABLET | ORAL | 3 refills | Status: DC

## 2021-05-19 NOTE — Progress Notes (Signed)
Chief Complaint  Patient presents with   Establish Care   New patient  1. Htn uncontrolled x years no on medications no PCP 2. Obesity lost weight from 400 lbs down to 292.6    Review of Systems  Constitutional:  Negative for weight loss.  HENT:  Negative for hearing loss.   Eyes:  Negative for blurred vision.  Respiratory:  Negative for shortness of breath.   Cardiovascular:  Negative for chest pain.  Gastrointestinal:  Negative for abdominal pain and blood in stool.  Genitourinary:  Negative for dysuria.  Musculoskeletal:  Negative for falls and joint pain.  Skin:  Negative for rash.  Neurological:  Negative for headaches.  Psychiatric/Behavioral:  Negative for depression.   Past Medical History:  Diagnosis Date   COVID-19    2020   Sinusitis    Trigger finger of right thumb    Past Surgical History:  Procedure Laterality Date   BREAST REDUCTION SURGERY Bilateral 05/30/1997   CESAREAN SECTION  05/31/1995   x1   CHOLECYSTECTOMY     CYSTOSCOPY  06/19/2015   Procedure: CYSTOSCOPY;  Surgeon: Lorette Ang, MD;  Location: [facility];  Service: Gynecology;;   KNEE ARTHROSCOPY Left 12/19/2019   Procedure: Arthroscopic partial lateral meniscectomy, abrasion chondroplasty of grade 3 chondromalacial changes of femoral trochlea and lateral tibial plateau, debridement of symptomatic plica, and subchondroplasty of subchondral stress fracture of lateral tibial plateau, left knee;  Surgeon: Corky Mull, MD;  Location: [facility];  Service: Orthopedics;  Laterality: Left;   LAPAROSCOPIC HYSTERECTOMY Bilateral 06/19/2015   Procedure: HYSTERECTOMY TOTAL LAPAROSCOPIC/BILATERAL SALPINGECTOMY/LABIAL CYST EXCISION;  Surgeon: Lorette Ang, MD;  Location: [facility];  Service: Gynecology;  Laterality: Bilateral;   right thumb trigger finger     Family History  Problem Relation Age of Onset   Stroke Mother    Hyperlipidemia Mother    Arthritis Mother    COPD Mother    Heart attack Mother     Heart disease Mother    Alcohol abuse Father    Asthma Son    Social History   Socioeconomic History   Marital status: Married    Spouse name: Not on file   Number of children: Not on file   Years of education: Not on file   Highest education level: Not on file  Occupational History   Not on file  Tobacco Use   Smoking status: Never   Smokeless tobacco: Never  Vaping Use   Vaping Use: Never used  Substance and Sexual Activity   Alcohol use: No   Drug use: No   Sexual activity: Not on file  Other Topics Concern   Not on file  Social History Narrative   1 son    1 daughter   [employer]   3 pregnancies 2 live births son and daughter   Social Determinants of Health   Financial Resource Strain: Not on file  Food Insecurity: Not on file  Transportation Needs: Not on file  Physical Activity: Not on file  Stress: Not on file  Social Connections: Not on file  Intimate Partner Violence: Not on file   Current Meds  Medication Sig   Blood Pressure KIT 1 Device by Does not apply route daily. I10 large arm size omron   cetirizine (ZYRTEC) 10 MG tablet Take 10 mg by mouth daily.   Cyanocobalamin (VITAMIN B 12 PO) Take 1 tablet by mouth every morning.   Ginger, Zingiber officinalis, (GINGER EXTRACT PO) Take 1 capsule  by mouth 3 (three) times daily.   losartan-hydrochlorothiazide (HYZAAR) 50-12.5 MG tablet 1/2 pill in the am x 3 day sand 1 pill day 4 and beyond   Allergies  Allergen Reactions   Honey Bee Venom [Bee Venom] Shortness Of Breath, Swelling and Anxiety   Peanuts [Peanut Oil] Anaphylaxis and Hives   Cinnamon Cough    sneeze   Dust Mite Extract Itching and Swelling    Sneezing, swollen and itchy eyes   No results found for this or any previous visit (from the past 2160 hour(s)). Objective  Body mass index is 48.4 kg/m. Wt Readings from Last 3 Encounters:  05/19/21 292 lb 9.6 oz (132.7 kg)  09/30/20 250 lb (113.4 kg)  12/19/19 300 lb (136.1 kg)   Temp  Readings from Last 3 Encounters:  05/19/21 97.6 F (36.4 C) (Temporal)  09/30/20 97.9 F (36.6 C) (Oral)  12/19/19 (!) 97 F (36.1 C) (Temporal)   BP Readings from Last 3 Encounters:  05/19/21 (!) 160/110  09/30/20 (!) 177/106  12/19/19 (!) 156/88   Pulse Readings from Last 3 Encounters:  05/19/21 92  09/30/20 76  12/19/19 82    Physical Exam Vitals and nursing note reviewed.  Constitutional:      Appearance: Normal appearance. She is well-developed and well-groomed.  HENT:     Head: Normocephalic and atraumatic.  Eyes:     Conjunctiva/sclera: Conjunctivae normal.     Pupils: Pupils are equal, round, and reactive to light.  Cardiovascular:     Rate and Rhythm: Normal rate and regular rhythm.     Heart sounds: Normal heart sounds. No murmur heard. Pulmonary:     Effort: Pulmonary effort is normal.     Breath sounds: Normal breath sounds.  Abdominal:     General: Abdomen is flat. Bowel sounds are normal.     Tenderness: There is no abdominal tenderness.  Musculoskeletal:        General: No tenderness.  Skin:    General: Skin is warm and dry.  Neurological:     General: No focal deficit present.     Mental Status: She is alert and oriented to person, place, and time. Mental status is at baseline.     Cranial Nerves: Cranial nerves 2-12 are intact.     Gait: Gait is intact.  Psychiatric:        Attention and Perception: Attention and perception normal.        Mood and Affect: Mood and affect normal.        Speech: Speech normal.        Behavior: Behavior normal. Behavior is cooperative.        Thought Content: Thought content normal.        Cognition and Memory: Cognition and memory normal.        Judgment: Judgment normal.    Assessment  Plan  Hypertension, uncontrolled - Plan: losartan-hydrochlorothiazide (HYZAAR) 50-12.5 MG tablet 1/2 pill x 3 days then 1 pill qd day 4, Comprehensive metabolic panel, Lipid panel, CBC with Differential/Platelet, Blood Pressure  KIT  HM Flu shot declines Pfizer 3/3 covid shots 03/06/20 pfizer consider 4th Tdap 05/08/15  Consider shingrix 2/2  S/p hysterectomy still has b/l ovaries for DUB fibroids, adenomyosis no h/o abnormal pap Mammogram referred  Consider DEXA in the future h/o hysterecomty  Colonoscopy will do when BP controlled    Specialist Dr. Mia Creek Provider: Dr. Olivia Mackie McLean-Scocuzza-Internal Medicine

## 2021-05-19 NOTE — Patient Instructions (Addendum)
Large blood cuff automatic omron  Log blood pressure please  Goal <130/<80  Losartan; Hydrochlorothiazide Tablets What is this medication? LOSARTAN; HYDROCHLOROTHIAZIDE (loe SAR tan; hye droe klor oh THYE a zide) treats high blood pressure. It may also be used to prevent a stroke in people with heart disease and high blood pressure. It relaxes your blood vessels and helps your kidneys remove more fluid through the urine, which lowers blood pressure. This medication is a combination of an ARB and diuretic. This medicine may be used for other purposes; ask your health care provider or pharmacist if you have questions. COMMON BRAND NAME(S): Hyzaar What should I tell my care team before I take this medication? They need to know if you have any of these conditions: Decreased urine Diabetes If you are on a special diet, like a low-salt diet Immune system problems, like lupus Kidney disease Liver disease An unusual or allergic reaction to losartan, hydrochlorothiazide, sulfa drugs, other medications, foods, dyes, or preservatives Pregnant or trying to get pregnant Breast-feeding How should I use this medication? Take this medication by mouth. Take it as directed on the prescription label at the same time every day. You can take it with or without food. If it upsets your stomach, take it with food. Keep taking it unless your care team tells you to stop. Talk to your care team about the use of this drug in children. Special care may be needed. Overdosage: If you think you have taken too much of this medicine contact a poison control center or emergency room at once. NOTE: This medicine is only for you. Do not share this medicine with others. What if I miss a dose? If you miss a dose, take it as soon as you can. If it is almost time for your next dose, take only that dose. Do not take double or extra doses. What may interact with this medication? Do not take this medication with any of the  following: Cidofovir Dofetilide Tranylcypromine This medication may also interact with the following: Barbiturates, like phenobarbital Blood pressure medications Celecoxib Diuretics, especially triamterene, spironolactone or amiloride Fluconazole Lithium Medications for diabetes Medications that relax the muscles for surgery Narcotic medications for pain NSAIDs, medications for pain and inflammation, like ibuprofen or naproxen Potassium salts or potassium supplements Rifampin Some cholesterol-lowering medications like cholestyramine or colestipol Steroid medications like prednisone or cortisone This list may not describe all possible interactions. Give your health care provider a list of all the medicines, herbs, non-prescription drugs, or dietary supplements you use. Also tell them if you smoke, drink alcohol, or use illegal drugs. Some items may interact with your medicine. What should I watch for while using this medication? Check your blood pressure regularly while you are taking this medication. Ask your care team what your blood pressure should be and when you should contact them. When you check your blood pressure, write down the measurements to show your care team. If you are taking this medication for a long time, you must visit your care team for regular checks on your progress. Make sure you schedule appointments on a regular basis. You must not get dehydrated. Ask your care team how much fluid you need to drink a day. Check with them if you get an attack of severe diarrhea, nausea and vomiting, or if you sweat a lot. The loss of too much body fluid can make it dangerous for you to take this medication. Women should inform their doctor if they wish to become  pregnant or think they might be pregnant. There is a potential for serious side effects to an unborn child, particularly in the second or third trimester. Talk to your care team or pharmacist for more information. You may get  drowsy or dizzy. Do not drive, use machinery, or do anything that needs mental alertness until you know how this medication affects you. Do not stand or sit up quickly, especially if you are an older patient. This reduces the risk of dizzy or fainting spells. Alcohol can make you more drowsy and dizzy. Avoid alcoholic drinks. This medication may increase blood sugar. Ask your care team if changes in diet or medications are needed if you have diabetes. Talk to your care team about your risk of skin cancer. You may be more at risk for skin cancer if you take this medication. This medication can make you more sensitive to the sun. Keep out of the sun. If you cannot avoid being in the sun, wear protective clothing and use sunscreen. Do not use sun lamps or tanning beds/booths. Avoid salt substitutes unless you are told otherwise by your care team. Do not treat yourself for coughs, colds, or pain while you are taking this medication without asking your care team for advice. Some ingredients may increase your blood pressure. What side effects may I notice from receiving this medication? Side effects that you should report to your care team as soon as possible: Allergic reactions--skin rash, itching, hives, swelling of the face, lips, tongue, or throat Dehydration--increased thirst, dry mouth, feeling faint or lightheaded, headache, dark yellow or brown urine Gout--severe pain, redness, warmth, or swelling in the joints, such as the big toe Kidney injury--decrease in the amount of urine, swelling of the ankles, hands, or feet Low blood pressure--dizziness, feeling faint or lightheaded, blurry vision Low potassium level--muscle pain or cramps, unusual weakness, fatigue, fast or irregular heartbeat, constipation Sudden eye pain or change in vision such as blurred vision, seeing halos around lights, vision loss Side effects that usually do not require medical attention (report to your care team if they continue  or are bothersome): Change in sex drive or performance Dizziness Headache Runny or stuffy nose Upset stomach This list may not describe all possible side effects. Call your doctor for medical advice about side effects. You may report side effects to FDA at 1-800-FDA-1088. Where should I keep my medication? Keep out of the reach of children and pets. Store at room temperature between 15 and 30 degrees C (59 and 86 degrees F). Protect from light. Keep the container tightly closed. Throw away any unused medication after the expiration date. NOTE: This sheet is a summary. It may not cover all possible information. If you have questions about this medicine, talk to your doctor, pharmacist, or health care provider.  2022 Elsevier/Gold Standard (2021-02-02 00:00:00)   DASH Eating Plan DASH stands for Dietary Approaches to Stop Hypertension. The DASH eating plan is a healthy eating plan that has been shown to: Reduce high blood pressure (hypertension). Reduce your risk for type 2 diabetes, heart disease, and stroke. Help with weight loss. What are tips for following this plan? Reading food labels Check food labels for the amount of salt (sodium) per serving. Choose foods with less than 5 percent of the Daily Value of sodium. Generally, foods with less than 300 milligrams (mg) of sodium per serving fit into this eating plan. To find whole grains, look for the word "whole" as the first word in the ingredient list.  Shopping Buy products labeled as "low-sodium" or "no salt added." Buy fresh foods. Avoid canned foods and pre-made or frozen meals. Cooking Avoid adding salt when cooking. Use salt-free seasonings or herbs instead of table salt or sea salt. Check with your health care provider or pharmacist before using salt substitutes. Do not fry foods. Cook foods using healthy methods such as baking, boiling, grilling, roasting, and broiling instead. Cook with heart-healthy oils, such as olive,  canola, avocado, soybean, or sunflower oil. Meal planning  Eat a balanced diet that includes: 4 or more servings of fruits and 4 or more servings of vegetables each day. Try to fill one-half of your plate with fruits and vegetables. 6-8 servings of whole grains each day. Less than 6 oz (170 g) of lean meat, poultry, or fish each day. A 3-oz (85-g) serving of meat is about the same size as a deck of cards. One egg equals 1 oz (28 g). 2-3 servings of low-fat dairy each day. One serving is 1 cup (237 mL). 1 serving of nuts, seeds, or beans 5 times each week. 2-3 servings of heart-healthy fats. Healthy fats called omega-3 fatty acids are found in foods such as walnuts, flaxseeds, fortified milks, and eggs. These fats are also found in cold-water fish, such as sardines, salmon, and mackerel. Limit how much you eat of: Canned or prepackaged foods. Food that is high in trans fat, such as some fried foods. Food that is high in saturated fat, such as fatty meat. Desserts and other sweets, sugary drinks, and other foods with added sugar. Full-fat dairy products. Do not salt foods before eating. Do not eat more than 4 egg yolks a week. Try to eat at least 2 vegetarian meals a week. Eat more home-cooked food and less restaurant, buffet, and fast food. Lifestyle When eating at a restaurant, ask that your food be prepared with less salt or no salt, if possible. If you drink alcohol: Limit how much you use to: 0-1 drink a day for women who are not pregnant. 0-2 drinks a day for men. Be aware of how much alcohol is in your drink. In the [place] one drink equals one 12 oz bottle of beer (355 mL), one 5 oz glass of wine (148 mL), or one 1 oz glass of hard liquor (44 mL). General information Avoid eating more than 2,300 mg of salt a day. If you have hypertension, you may need to reduce your sodium intake to 1,500 mg a day. Work with your health care provider to maintain a healthy body weight or to lose  weight. Ask what an ideal weight is for you. Get at least 30 minutes of exercise that causes your heart to beat faster (aerobic exercise) most days of the week. Activities may include walking, swimming, or biking. Work with your health care provider or dietitian to adjust your eating plan to your individual calorie needs. What foods should I eat? Fruits All fresh, dried, or frozen fruit. Canned fruit in natural juice (without added sugar). Vegetables Fresh or frozen vegetables (raw, steamed, roasted, or grilled). Low-sodium or reduced-sodium tomato and vegetable juice. Low-sodium or reduced-sodium tomato sauce and tomato paste. Low-sodium or reduced-sodium canned vegetables. Grains Whole-grain or whole-wheat bread. Whole-grain or whole-wheat pasta. Brown rice. Orpah Cobb. Bulgur. Whole-grain and low-sodium cereals. Pita bread. Low-fat, low-sodium crackers. Whole-wheat flour tortillas. Meats and other proteins Skinless chicken or [place]. Ground chicken or [place]. Pork with fat trimmed off. Fish and seafood. Egg whites. Dried beans, peas, or lentils.  Unsalted nuts, nut butters, and seeds. Unsalted canned beans. Lean cuts of beef with fat trimmed off. Low-sodium, lean precooked or cured meat, such as sausages or meat loaves. Dairy Low-fat (1%) or fat-free (skim) milk. Reduced-fat, low-fat, or fat-free cheeses. Nonfat, low-sodium ricotta or cottage cheese. Low-fat or nonfat yogurt. Low-fat, low-sodium cheese. Fats and oils Soft margarine without trans fats. Vegetable oil. Reduced-fat, low-fat, or light mayonnaise and salad dressings (reduced-sodium). Canola, safflower, olive, avocado, soybean, and sunflower oils. Avocado. Seasonings and condiments Herbs. Spices. Seasoning mixes without salt. Other foods Unsalted popcorn and pretzels. Fat-free sweets. The items listed above may not be a complete list of foods and beverages you can eat. Contact a dietitian for more information. What foods should  I avoid? Fruits Canned fruit in a light or heavy syrup. Fried fruit. Fruit in cream or butter sauce. Vegetables Creamed or fried vegetables. Vegetables in a cheese sauce. Regular canned vegetables (not low-sodium or reduced-sodium). Regular canned tomato sauce and paste (not low-sodium or reduced-sodium). Regular tomato and vegetable juice (not low-sodium or reduced-sodium). Rosita Fire. Olives. Grains Baked goods made with fat, such as croissants, muffins, or some breads. Dry pasta or rice meal packs. Meats and other proteins Fatty cuts of meat. Ribs. Fried meat. Tomasa Blase. Bologna, salami, and other precooked or cured meats, such as sausages or meat loaves. Fat from the back of a pig (fatback). Bratwurst. Salted nuts and seeds. Canned beans with added salt. Canned or smoked fish. Whole eggs or egg yolks. Chicken or [place] with skin. Dairy Whole or 2% milk, cream, and half-and-half. Whole or full-fat cream cheese. Whole-fat or sweetened yogurt. Full-fat cheese. Nondairy creamers. Whipped toppings. Processed cheese and cheese spreads. Fats and oils Butter. Stick margarine. Lard. Shortening. Ghee. Bacon fat. Tropical oils, such as coconut, palm kernel, or palm oil. Seasonings and condiments Onion salt, garlic salt, seasoned salt, table salt, and sea salt. Worcestershire sauce. Tartar sauce. Barbecue sauce. Teriyaki sauce. Soy sauce, including reduced-sodium. Steak sauce. Canned and packaged gravies. Fish sauce. Oyster sauce. Cocktail sauce. Store-bought horseradish. Ketchup. Mustard. Meat flavorings and tenderizers. Bouillon cubes. Hot sauces. Pre-made or packaged marinades. Pre-made or packaged taco seasonings. Relishes. Regular salad dressings. Other foods Salted popcorn and pretzels. The items listed above may not be a complete list of foods and beverages you should avoid. Contact a dietitian for more information. Where to find more information National Heart, Lung, and Blood Institute:  PopSteam.is American Heart Association: www.heart.org Academy of Nutrition and Dietetics: www.eatright.org National Kidney Foundation: www.kidney.org Summary The DASH eating plan is a healthy eating plan that has been shown to reduce high blood pressure (hypertension). It may also reduce your risk for type 2 diabetes, heart disease, and stroke. When on the DASH eating plan, aim to eat more fresh fruits and vegetables, whole grains, lean proteins, low-fat dairy, and heart-healthy fats. With the DASH eating plan, you should limit salt (sodium) intake to 2,300 mg a day. If you have hypertension, you may need to reduce your sodium intake to 1,500 mg a day. Work with your health care provider or dietitian to adjust your eating plan to your individual calorie needs. This information is not intended to replace advice given to you by your health care provider. Make sure you discuss any questions you have with your health care provider. Document Revised: 04/19/2019 Document Reviewed: 04/19/2019 Elsevier Patient Education  2022 Else[employer] Inc.  Zoster Vaccine, Recombinant injection x 2 doses 2nd dose before 6 months   What is this medication? ZOSTER VACCINE (ZOS  ter vak SEEN) is a vaccine used to reduce the risk of getting shingles. This vaccine is not used to treat shingles or nerve pain from shingles. This medicine may be used for other purposes; ask your health care provider or pharmacist if you have questions. COMMON BRAND NAME(S): [facility] What should I tell my care team before I take this medication? They need to know if you have any of these conditions: cancer immune system problems an unusual or allergic reaction to Zoster vaccine, other medications, foods, dyes, or preservatives pregnant or trying to get pregnant breast-feeding How should I use this medication? This vaccine is injected into a muscle. It is given by a health care provider. A copy of Vaccine Information Statements will  be given before each vaccination. Be sure to read this information carefully each time. This sheet may change often. Talk to your health care provider about the use of this vaccine in children. This vaccine is not approved for use in children. Overdosage: If you think you have taken too much of this medicine contact a poison control center or emergency room at once. NOTE: This medicine is only for you. Do not share this medicine with others. What if I miss a dose? Keep appointments for follow-up (booster) doses. It is important not to miss your dose. Call your health care provider if you are unable to keep an appointment. What may interact with this medication? medicines that suppress your immune system medicines to treat cancer steroid medicines like prednisone or cortisone This list may not describe all possible interactions. Give your health care provider a list of all the medicines, herbs, non-prescription drugs, or dietary supplements you use. Also tell them if you smoke, drink alcohol, or use illegal drugs. Some items may interact with your medicine. What should I watch for while using this medication? Visit your health care provider regularly. This vaccine, like all vaccines, may not fully protect everyone. What side effects may I notice from receiving this medication? Side effects that you should report to your doctor or health care professional as soon as possible: allergic reactions (skin rash, itching or hives; swelling of the face, lips, or tongue) trouble breathing Side effects that usually do not require medical attention (report these to your doctor or health care professional if they continue or are bothersome): chills headache fever nausea pain, redness, or irritation at site where injected tiredness vomiting This list may not describe all possible side effects. Call your doctor for medical advice about side effects. You may report side effects to FDA at  1-800-FDA-1088. Where should I keep my medication? This vaccine is only given by a health care provider. It will not be stored at home. NOTE: This sheet is a summary. It may not cover all possible information. If you have questions about this medicine, talk to your doctor, pharmacist, or health care provider.  2022 Elsevier/Gold Standard (2021-02-02 00:00:00)  Hypertension, Adult High blood pressure (hypertension) is when the force of blood pumping through the arteries is too strong. The arteries are the blood vessels that carry blood from the heart throughout the body. Hypertension forces the heart to work harder to pump blood and may cause arteries to become narrow or stiff. Untreated or uncontrolled hypertension can cause a heart attack, heart failure, a stroke, kidney disease, and other problems. A blood pressure reading consists of a higher number over a lower number. Ideally, your blood pressure should be below 120/80. The first ("top") number is called the systolic pressure.  It is a measure of the pressure in your arteries as your heart beats. The second ("bottom") number is called the diastolic pressure. It is a measure of the pressure in your arteries as the heart relaxes. What are the causes? The exact cause of this condition is not known. There are some conditions that result in or are related to high blood pressure. What increases the risk? Some risk factors for high blood pressure are under your control. The following factors may make you more likely to develop this condition: Smoking. Having type 2 diabetes mellitus, high cholesterol, or both. Not getting enough exercise or physical activity. Being overweight. Having too much fat, sugar, calories, or salt (sodium) in your diet. Drinking too much alcohol. Some risk factors for high blood pressure may be difficult or impossible to change. Some of these factors include: Having chronic kidney disease. Having a family history of high  blood pressure. Age. Risk increases with age. Race. You may be at higher risk if you are African American. Gender. Men are at higher risk than women before age 45. After age 10, women are at higher risk than men. Having obstructive sleep apnea. Stress. What are the signs or symptoms? High blood pressure may not cause symptoms. Very high blood pressure (hypertensive crisis) may cause: Headache. Anxiety. Shortness of breath. Nosebleed. Nausea and vomiting. Vision changes. Severe chest pain. Seizures. How is this diagnosed? This condition is diagnosed by measuring your blood pressure while you are seated, with your arm resting on a flat surface, your legs uncrossed, and your feet flat on the floor. The cuff of the blood pressure monitor will be placed directly against the skin of your upper arm at the level of your heart. It should be measured at least twice using the same arm. Certain conditions can cause a difference in blood pressure between your right and left arms. Certain factors can cause blood pressure readings to be lower or higher than normal for a short period of time: When your blood pressure is higher when you are in a health care provider's office than when you are at home, this is called white coat hypertension. Most people with this condition do not need medicines. When your blood pressure is higher at home than when you are in a health care provider's office, this is called masked hypertension. Most people with this condition may need medicines to control blood pressure. If you have a high blood pressure reading during one visit or you have normal blood pressure with other risk factors, you may be asked to: Return on a different day to have your blood pressure checked again. Monitor your blood pressure at home for 1 week or longer. If you are diagnosed with hypertension, you may have other blood or imaging tests to help your health care provider understand your overall risk for  other conditions. How is this treated? This condition is treated by making healthy lifestyle changes, such as eating healthy foods, exercising more, and reducing your alcohol intake. Your health care provider may prescribe medicine if lifestyle changes are not enough to get your blood pressure under control, and if: Your systolic blood pressure is above 130. Your diastolic blood pressure is above 80. Your personal target blood pressure may vary depending on your medical conditions, your age, and other factors. Follow these instructions at home: Eating and drinking  Eat a diet that is high in fiber and potassium, and low in sodium, added sugar, and fat. An example eating plan is  called the DASH (Dietary Approaches to Stop Hypertension) diet. To eat this way: Eat plenty of fresh fruits and vegetables. Try to fill one half of your plate at each meal with fruits and vegetables. Eat whole grains, such as whole-wheat pasta, brown rice, or whole-grain bread. Fill about one fourth of your plate with whole grains. Eat or drink low-fat dairy products, such as skim milk or low-fat yogurt. Avoid fatty cuts of meat, processed or cured meats, and poultry with skin. Fill about one fourth of your plate with lean proteins, such as fish, chicken without skin, beans, eggs, or tofu. Avoid pre-made and processed foods. These tend to be higher in sodium, added sugar, and fat. Reduce your daily sodium intake. Most people with hypertension should eat less than 1,500 mg of sodium a day. Do not drink alcohol if: Your health care provider tells you not to drink. You are pregnant, may be pregnant, or are planning to become pregnant. If you drink alcohol: Limit how much you use to: 0-1 drink a day for women. 0-2 drinks a day for men. Be aware of how much alcohol is in your drink. In the [place] one drink equals one 12 oz bottle of beer (355 mL), one 5 oz glass of wine (148 mL), or one 1 oz glass of hard liquor (44  mL). Lifestyle  Work with your health care provider to maintain a healthy body weight or to lose weight. Ask what an ideal weight is for you. Get at least 30 minutes of exercise most days of the week. Activities may include walking, swimming, or biking. Include exercise to strengthen your muscles (resistance exercise), such as Pilates or lifting weights, as part of your weekly exercise routine. Try to do these types of exercises for 30 minutes at least 3 days a week. Do not use any products that contain nicotine or tobacco, such as cigarettes, e-cigarettes, and chewing tobacco. If you need help quitting, ask your health care provider. Monitor your blood pressure at home as told by your health care provider. Keep all follow-up visits as told by your health care provider. This is important. Medicines Take over-the-counter and prescription medicines only as told by your health care provider. Follow directions carefully. Blood pressure medicines must be taken as prescribed. Do not skip doses of blood pressure medicine. Doing this puts you at risk for problems and can make the medicine less effective. Ask your health care provider about side effects or reactions to medicines that you should watch for. Contact a health care provider if you: Think you are having a reaction to a medicine you are taking. Have headaches that keep coming back (recurring). Feel dizzy. Have swelling in your ankles. Have trouble with your vision. Get help right away if you: Develop a severe headache or confusion. Have unusual weakness or numbness. Feel faint. Have severe pain in your chest or abdomen. Vomit repeatedly. Have trouble breathing. Summary Hypertension is when the force of blood pumping through your arteries is too strong. If this condition is not controlled, it may put you at risk for serious complications. Your personal target blood pressure may vary depending on your medical conditions, your age, and  other factors. For most people, a normal blood pressure is less than 120/80. Hypertension is treated with lifestyle changes, medicines, or a combination of both. Lifestyle changes include losing weight, eating a healthy, low-sodium diet, exercising more, and limiting alcohol. This information is not intended to replace advice given to you by your health  care provider. Make sure you discuss any questions you have with your health care provider. Document Revised: 01/24/2018 Document Reviewed: 01/24/2018 Elsevier Patient Education  2022 [employer].  How to Take Your Blood Pressure Blood pressure is a measurement of how strongly your blood is pressing against the walls of your arteries. Arteries are blood vessels that carry blood from your heart throughout your body. Your health care provider takes your blood pressure at each office visit. You can also take your own blood pressure at home with a blood pressure monitor. You may need to take your own blood pressure to: Confirm a diagnosis of high blood pressure (hypertension). Monitor your blood pressure over time. Make sure your blood pressure medicine is working. Supplies needed: Blood pressure monitor. Dining room chair to sit in. Table or desk. Small notebook and pencil or pen. How to prepare To get the most accurate reading, avoid the following for 30 minutes before you check your blood pressure: Drinking caffeine. Drinking alcohol. Eating. Smoking. Exercising. Five minutes before you check your blood pressure: Use the bathroom and urinate so that you have an empty bladder. Sit quietly in a dining room chair. Do not sit in a soft couch or an armchair. Do not talk. How to take your blood pressure To check your blood pressure, follow the instructions in the manual that came with your blood pressure monitor. If you have a digital blood pressure monitor, the instructions may be as follows: Sit up straight in a chair. Place your feet on  the floor. Do not cross your ankles or legs. Rest your left arm at the level of your heart on a table or desk or on the arm of a chair. Pull up your shirt sleeve. Wrap the blood pressure cuff around the upper part of your left arm, 1 inch (2.5 cm) above your elbow. It is best to wrap the cuff around bare skin. Fit the cuff snugly around your arm. You should be able to place only one finger between the cuff and your arm. Position the cord so that it rests in the bend of your elbow. Press the power button. Sit quietly while the cuff inflates and deflates. Read the digital reading on the monitor screen and write the numbers down (record them) in a notebook. Wait 2-3 minutes, then repeat the steps, starting at step 1. What does my blood pressure reading mean? A blood pressure reading consists of a higher number over a lower number. Ideally, your blood pressure should be below 120/80. The first ("top") number is called the systolic pressure. It is a measure of the pressure in your arteries as your heart beats. The second ("bottom") number is called the diastolic pressure. It is a measure of the pressure in your arteries as the heart relaxes. Blood pressure is classified into five stages. The following are the stages for adults who do not have a short-term serious illness or a chronic condition. Systolic pressure and diastolic pressure are measured in a unit called mm Hg (millimeters of mercury).  Normal Systolic pressure: below 120. Diastolic pressure: below 80. Elevated Systolic pressure: 120-129. Diastolic pressure: below 80. Hypertension stage 1 Systolic pressure: 130-139. Diastolic pressure: 80-89. Hypertension stage 2 Systolic pressure: 140 or above. Diastolic pressure: 90 or above. You can have elevated blood pressure or hypertension even if only the systolic or only the diastolic number in your reading is higher than normal. Follow these instructions at home: Medicines Take  over-the-counter and prescription medicines only as told  by your health care provider. Tell your health care provider if you are having any side effects from blood pressure medicine. General instructions Check your blood pressure as often as recommended by your health care provider. Check your blood pressure at the same time every day. Take your monitor to the next appointment with your health care provider to make sure that: You are using it correctly. It provides accurate readings. Understand what your goal blood pressure numbers are. Keep all follow-up visits as told by your health care provider. This is important. General tips Your health care provider can suggest a reliable monitor that will meet your needs. There are several types of home blood pressure monitors. Choose a monitor that has an arm cuff. Do not choose a monitor that measures your blood pressure from your wrist or finger. Choose a cuff that wraps snugly around your upper arm. You should be able to fit only one finger between your arm and the cuff. You can buy a blood pressure monitor at most drugstores or online. Where to find more information American Heart Association: www.heart.org Contact a health care provider if: Your blood pressure is consistently high. Your blood pressure is suddenly low. Get help right away if: Your systolic blood pressure is higher than 180. Your diastolic blood pressure is higher than 120. Summary Blood pressure is a measurement of how strongly your blood is pressing against the walls of your arteries. A blood pressure reading consists of a higher number over a lower number. Ideally, your blood pressure should be below 120/80. Check your blood pressure at the same time every day. Avoid caffeine, alcohol, smoking, and exercise for 30 minutes prior to checking your blood pressure. These agents can affect the accuracy of the blood pressure reading. This information is not intended to replace  advice given to you by your health care provider. Make sure you discuss any questions you have with your health care provider. Document Revised: 03/25/2020 Document Reviewed: 05/10/2019 Elsevier Patient Education  2022 [employer].

## 2021-05-26 ENCOUNTER — Other Ambulatory Visit (INDEPENDENT_AMBULATORY_CARE_PROVIDER_SITE_OTHER): Payer: BC Managed Care – PPO

## 2021-05-26 ENCOUNTER — Other Ambulatory Visit: Payer: Self-pay

## 2021-05-26 DIAGNOSIS — Z1329 Encounter for screening for other suspected endocrine disorder: Secondary | ICD-10-CM | POA: Diagnosis not present

## 2021-05-26 DIAGNOSIS — I10 Essential (primary) hypertension: Secondary | ICD-10-CM

## 2021-05-26 DIAGNOSIS — Z0184 Encounter for antibody response examination: Secondary | ICD-10-CM | POA: Diagnosis not present

## 2021-05-26 DIAGNOSIS — R739 Hyperglycemia, unspecified: Secondary | ICD-10-CM

## 2021-05-26 DIAGNOSIS — E559 Vitamin D deficiency, unspecified: Secondary | ICD-10-CM | POA: Diagnosis not present

## 2021-05-26 DIAGNOSIS — Z1389 Encounter for screening for other disorder: Secondary | ICD-10-CM

## 2021-05-27 LAB — URINALYSIS, ROUTINE W REFLEX MICROSCOPIC
Bacteria, UA: NONE SEEN /HPF
Bilirubin Urine: NEGATIVE
Glucose, UA: NEGATIVE
Hgb urine dipstick: NEGATIVE
Hyaline Cast: NONE SEEN /LPF
Ketones, ur: NEGATIVE
Nitrite: NEGATIVE
Protein, ur: NEGATIVE
RBC / HPF: NONE SEEN /HPF (ref 0–2)
Specific Gravity, Urine: 1.008 (ref 1.001–1.035)
Squamous Epithelial / HPF: NONE SEEN /HPF (ref <=5)
WBC, UA: NONE SEEN /HPF (ref 0–5)
pH: 6.5 (ref 5.0–8.0)

## 2021-05-27 LAB — CBC WITH DIFFERENTIAL/PLATELET
Basophils Absolute: 0.1 10*3/uL (ref 0.0–0.1)
Basophils Relative: 1.1 % (ref 0.0–3.0)
Eosinophils Absolute: 0.1 10*3/uL (ref 0.0–0.7)
Eosinophils Relative: 1.4 % (ref 0.0–5.0)
HCT: 41.6 % (ref 36.0–46.0)
Hemoglobin: 13.8 g/dL (ref 12.0–15.0)
Lymphocytes Relative: 45.7 % (ref 12.0–46.0)
Lymphs Abs: 2.8 10*3/uL (ref 0.7–4.0)
MCHC: 33.2 g/dL (ref 30.0–36.0)
MCV: 93.4 fl (ref 78.0–100.0)
Monocytes Absolute: 0.5 10*3/uL (ref 0.1–1.0)
Monocytes Relative: 8.7 % (ref 3.0–12.0)
Neutro Abs: 2.6 10*3/uL (ref 1.4–7.7)
Neutrophils Relative %: 43.1 % (ref 43.0–77.0)
Platelets: 318 10*3/uL (ref 150.0–400.0)
RBC: 4.45 Mil/uL (ref 3.87–5.11)
RDW: 13.8 % (ref 11.5–15.5)
WBC: 6.1 10*3/uL (ref 4.0–10.5)

## 2021-05-27 LAB — LIPID PANEL
Cholesterol: 204 mg/dL — ABNORMAL HIGH (ref 0–200)
HDL: 67.6 mg/dL (ref >39.00)
LDL Cholesterol: 123 mg/dL — ABNORMAL HIGH (ref 0–99)
NonHDL: 136.68
Total CHOL/HDL Ratio: 3
Triglycerides: 67 mg/dL (ref 0.0–149.0)
VLDL: 13.4 mg/dL (ref 0.0–40.0)

## 2021-05-27 LAB — COMPREHENSIVE METABOLIC PANEL
ALT: 13 U/L (ref 0–35)
AST: 15 U/L (ref 0–37)
Albumin: 4 g/dL (ref 3.5–5.2)
Alkaline Phosphatase: 73 U/L (ref 39–117)
BUN: 10 mg/dL (ref 6–23)
CO2: 30 mEq/L (ref 19–32)
Calcium: 9.4 mg/dL (ref 8.4–10.5)
Chloride: 100 mEq/L (ref 96–112)
Creatinine, Ser: 0.74 mg/dL (ref 0.40–1.20)
GFR: 92.38 mL/min (ref >60.00)
Glucose, Bld: 84 mg/dL (ref 70–99)
Potassium: 4.1 mEq/L (ref 3.5–5.1)
Sodium: 137 mEq/L (ref 135–145)
Total Bilirubin: 0.5 mg/dL (ref 0.2–1.2)
Total Protein: 7.4 g/dL (ref 6.0–8.3)

## 2021-05-27 LAB — HEPATITIS B SURFACE ANTIBODY, QUANTITATIVE: Hep B S AB Quant (Post): <5 m[IU]/mL — ABNORMAL LOW (ref >=10)

## 2021-05-27 LAB — TSH: TSH: 1.62 u[IU]/mL (ref 0.35–5.50)

## 2021-05-27 LAB — VITAMIN D 25 HYDROXY (VIT D DEFICIENCY, FRACTURES): VITD: 26.12 ng/mL — ABNORMAL LOW (ref 30.00–100.00)

## 2021-05-27 LAB — HEMOGLOBIN A1C: Hgb A1c MFr Bld: 5.7 % (ref 4.6–6.5)

## 2021-05-28 ENCOUNTER — Encounter: Payer: Self-pay | Admitting: Internal Medicine

## 2021-05-28 ENCOUNTER — Other Ambulatory Visit: Payer: Self-pay

## 2021-05-28 ENCOUNTER — Ambulatory Visit: Payer: BC Managed Care – PPO | Admitting: Internal Medicine

## 2021-05-28 VITALS — BP 136/84 | HR 69 | Temp 98.0°F | Resp 16 | Ht 65.0 in | Wt 291.0 lb

## 2021-05-28 DIAGNOSIS — Z0000 Encounter for general adult medical examination without abnormal findings: Secondary | ICD-10-CM

## 2021-05-28 DIAGNOSIS — R7303 Prediabetes: Secondary | ICD-10-CM | POA: Diagnosis not present

## 2021-05-28 DIAGNOSIS — I10 Essential (primary) hypertension: Secondary | ICD-10-CM | POA: Diagnosis not present

## 2021-05-28 DIAGNOSIS — Z1211 Encounter for screening for malignant neoplasm of colon: Secondary | ICD-10-CM

## 2021-05-28 DIAGNOSIS — E785 Hyperlipidemia, unspecified: Secondary | ICD-10-CM | POA: Insufficient documentation

## 2021-05-28 DIAGNOSIS — E559 Vitamin D deficiency, unspecified: Secondary | ICD-10-CM | POA: Insufficient documentation

## 2021-05-28 NOTE — Progress Notes (Signed)
Chief Complaint  Patient presents with   Hypertension   Annual Exam   Annual  1. Htn improved on losartan 50-12.5 qd took 1/2 pill x 3 days then 1 pill qd now x 1 week doing well bp at times 120/s70s-80s and tolerating  2. Reviewed labs + prediabetes she is exercising and trying to lose weight    Review of Systems  Constitutional:  Negative for weight loss.  HENT:  Negative for hearing loss.   Eyes:  Negative for blurred vision.  Respiratory:  Negative for shortness of breath.   Cardiovascular:  Negative for chest pain.  Gastrointestinal:  Negative for abdominal pain and blood in stool.  Genitourinary:  Negative for dysuria.  Musculoskeletal:  Negative for falls and joint pain.  Skin:  Negative for rash.  Neurological:  Negative for headaches.  Psychiatric/Behavioral:  Negative for depression.   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 and lateral tibial plateau, debridement of symptomatic plica, and subchondroplasty of subchondral stress fracture of lateral tibial plateau, left knee;  Surgeon: Christena Flake, MD;  Location: [facility];  Service: Orthopedics;  Laterality: Left;   LAPAROSCOPIC HYSTERECTOMY Bilateral 06/19/2015   Procedure: HYSTERECTOMY TOTAL LAPAROSCOPIC/BILATERAL SALPINGECTOMY/LABIAL CYST EXCISION;  Surgeon: Ala Dach, MD;  Location: [facility];  Service: Gynecology;  Laterality: Bilateral;   right thumb trigger finger     Family History  Problem  Relation Age of Onset   Stroke Mother    Hyperlipidemia Mother    Arthritis Mother    COPD Mother    Heart attack Mother    Heart disease Mother    Alcohol abuse Father    Asthma Son    Social History   Socioeconomic History   Marital status: Married    Spouse name: Not on file   Number of children: Not on file   Years of education: Not on file   Highest education level: Not on file  Occupational History   Not on file  Tobacco Use   Smoking status: Never   Smokeless tobacco: Never  Vaping Use   Vaping Use: Never used  Substance and Sexual Activity   Alcohol use: No   Drug use: No   Sexual activity: Not on file  Other Topics Concern   Not on file  Social History Narrative   1 son    1 daughter   [employer] teacher   3 pregnancies 2 live births son and daughter   Social Determinants of Health   Financial Resource Strain: Not on file  Food Insecurity: Not on file  Transportation Needs: Not on file  Physical Activity: Not on file  Stress: Not on file  Social Connections: Not on file  Intimate Partner Violence: Not on file   No outpatient medications have been marked as taking for the 05/28/21 encounter (Office Visit) with McLean-Scocuzza, Pasty Spillers, MD.   Allergies  Allergen Reactions   Honey Bee Venom [Bee Venom] Shortness Of Breath, Swelling  and Anxiety   Peanuts [Peanut Oil] Anaphylaxis and Hives   Cinnamon Cough    sneeze   Dust Mite Extract Itching and Swelling    Sneezing, swollen and itchy eyes   Recent Results (from the past 2160 hour(s))  Hepatitis B surface antibody,quantitative     Status: Abnormal   Collection Time: 05/26/21  2:23 PM  Result Value Ref Range   Hepatitis B-Post <5 (L) > OR = 10 mIU/mL    Comment: . Patient does not have immunity to hepatitis B virus. . For additional information, please refer to http://education.questdiagnostics.com/faq/FAQ105 (This link is being provided for informational/ educational purposes only).   Vitamin D (25  hydroxy)     Status: Abnormal   Collection Time: 05/26/21  2:23 PM  Result Value Ref Range   VITD 26.12 (L) 30.00 - 100.00 ng/mL  TSH     Status: None   Collection Time: 05/26/21  2:23 PM  Result Value Ref Range   TSH 1.62 0.35 - 5.50 uIU/mL  CBC with Differential/Platelet     Status: None   Collection Time: 05/26/21  2:23 PM  Result Value Ref Range   WBC 6.1 4.0 - 10.5 K/uL   RBC 4.45 3.87 - 5.11 Mil/uL   Hemoglobin 13.8 12.0 - 15.0 g/dL   HCT 63.8 75.6 - 43.3 %   MCV 93.4 78.0 - 100.0 fl   MCHC 33.2 30.0 - 36.0 g/dL   RDW 29.5 18.8 - 41.6 %   Platelets 318.0 150.0 - 400.0 K/uL   Neutrophils Relative % 43.1 43.0 - 77.0 %   Lymphocytes Relative 45.7 12.0 - 46.0 %   Monocytes Relative 8.7 3.0 - 12.0 %   Eosinophils Relative 1.4 0.0 - 5.0 %   Basophils Relative 1.1 0.0 - 3.0 %   Neutro Abs 2.6 1.4 - 7.7 K/uL   Lymphs Abs 2.8 0.7 - 4.0 K/uL   Monocytes Absolute 0.5 0.1 - 1.0 K/uL   Eosinophils Absolute 0.1 0.0 - 0.7 K/uL   Basophils Absolute 0.1 0.0 - 0.1 K/uL  Hemoglobin A1c     Status: None   Collection Time: 05/26/21  2:23 PM  Result Value Ref Range   Hgb A1c MFr Bld 5.7 4.6 - 6.5 %    Comment: Glycemic Control Guidelines for People with Diabetes:Non Diabetic:  <6%Goal of Therapy: <7%Additional Action Suggested:  >8%   Urinalysis, Routine w reflex microscopic     Status: Abnormal   Collection Time: 05/26/21  2:23 PM  Result Value Ref Range   Color, Urine YELLOW YELLOW   APPearance CLEAR CLEAR   Specific Gravity, Urine 1.008 1.001 - 1.035   pH 6.5 5.0 - 8.0   Glucose, UA NEGATIVE NEGATIVE   Bilirubin Urine NEGATIVE NEGATIVE   Ketones, ur NEGATIVE NEGATIVE   Hgb urine dipstick NEGATIVE NEGATIVE   Protein, ur NEGATIVE NEGATIVE   Nitrite NEGATIVE NEGATIVE   Leukocytes,Ua TRACE (A) NEGATIVE   WBC, UA NONE SEEN 0 - 5 /HPF   RBC / HPF NONE SEEN 0 - 2 /HPF   Squamous Epithelial / LPF NONE SEEN < OR = 5 /HPF   Bacteria, UA NONE SEEN NONE SEEN /HPF   Hyaline Cast NONE SEEN  NONE SEEN /LPF  Comprehensive metabolic panel     Status: None   Collection Time: 05/26/21  2:23 PM  Result Value Ref Range   Sodium 137 135 - 145 mEq/L   Potassium 4.1 3.5 - 5.1 mEq/L   Chloride 100 96 - 112  mEq/L   CO2 30 19 - 32 mEq/L   Glucose, Bld 84 70 - 99 mg/dL   BUN 10 6 - 23 mg/dL   Creatinine, Ser 4.09 0.40 - 1.20 mg/dL   Total Bilirubin 0.5 0.2 - 1.2 mg/dL   Alkaline Phosphatase 73 39 - 117 U/L   AST 15 0 - 37 U/L   ALT 13 0 - 35 U/L   Total Protein 7.4 6.0 - 8.3 g/dL   Albumin 4.0 3.5 - 5.2 g/dL   GFR 81.19 >14.78 mL/min    Comment: Calculated using the CKD-EPI Creatinine Equation (2021)   Calcium 9.4 8.4 - 10.5 mg/dL  Lipid panel     Status: Abnormal   Collection Time: 05/26/21  2:23 PM  Result Value Ref Range   Cholesterol 204 (H) 0 - 200 mg/dL    Comment: ATP III Classification       Desirable:  < 200 mg/dL               Borderline High:  200 - 239 mg/dL          High:  > = 295 mg/dL   Triglycerides 62.1 0.0 - 149.0 mg/dL    Comment: Normal:  <308 mg/dLBorderline High:  150 - 199 mg/dL   HDL 65.78 >46.96 mg/dL   VLDL 29.5 0.0 - 28.4 mg/dL   LDL Cholesterol 132 (H) 0 - 99 mg/dL   Total CHOL/HDL Ratio 3     Comment:                Men          Women1/2 Average Risk     3.4          3.3Average Risk          5.0          4.42X Average Risk          9.6          7.13X Average Risk          15.0          11.0                       NonHDL 136.68     Comment: NOTE:  Non-HDL goal should be 30 mg/dL higher than patient's LDL goal (i.e. LDL goal of < 70 mg/dL, would have non-HDL goal of < 100 mg/dL)   Objective  Body mass index is 48.42 kg/m. Wt Readings from Last 3 Encounters:  05/28/21 291 lb (132 kg)  05/19/21 292 lb 9.6 oz (132.7 kg)  09/30/20 250 lb (113.4 kg)   Temp Readings from Last 3 Encounters:  05/28/21 98 F (36.7 C)  05/19/21 97.6 F (36.4 C) (Temporal)  09/30/20 97.9 F (36.6 C) (Oral)   BP Readings from Last 3 Encounters:  05/28/21 136/84   05/19/21 (!) 160/110  09/30/20 (!) 177/106   Pulse Readings from Last 3 Encounters:  05/28/21 69  05/19/21 92  09/30/20 76    Physical Exam Vitals and nursing note reviewed.  Constitutional:      Appearance: Normal appearance. She is well-developed and well-groomed.  HENT:     Head: Normocephalic and atraumatic.  Eyes:     Conjunctiva/sclera: Conjunctivae normal.     Pupils: Pupils are equal, round, and reactive to light.  Cardiovascular:     Rate and Rhythm: Normal rate and regular rhythm.     Heart sounds: Normal heart sounds. No  murmur heard. Pulmonary:     Effort: Pulmonary effort is normal.     Breath sounds: Normal breath sounds.  Abdominal:     General: Abdomen is flat. Bowel sounds are normal.     Tenderness: There is no abdominal tenderness.  Musculoskeletal:        General: No tenderness.  Skin:    General: Skin is warm and dry.  Neurological:     General: No focal deficit present.     Mental Status: She is alert and oriented to person, place, and time. Mental status is at baseline.     Cranial Nerves: Cranial nerves 2-12 are intact.     Gait: Gait is intact.  Psychiatric:        Attention and Perception: Attention and perception normal.        Mood and Affect: Mood and affect normal.        Speech: Speech normal.        Behavior: Behavior normal. Behavior is cooperative.        Thought Content: Thought content normal.        Cognition and Memory: Cognition and memory normal.        Judgment: Judgment normal.    Assessment  Plan  Hypertension, improved- Plan: BASIC METABOLIC PANEL WITH GFR,  Cont losartan hctz 50-12.5 mg qd   Prediabetes 5.7 Healthy diet and exercise  Hyperlipidemia, if not improving consider statin    Annual physical exam  today Flu shot declines Pfizer 3/3 covid shots 03/06/20 pfizer consider 4th Tdap 05/08/15  Consider shingrix 2/2, prevnar    S/p hysterectomy still has b/l ovaries for DUB fibroids, adenomyosis no h/o  abnormal pap  Mammogram referred sch 07/12/20   Consider DEXA in the future h/o hysterectomy   Colonoscopy will do when BP controlled referred today to [facility]   Rec healthy diet and exercise   Specialist Dr. Maryan Puls ortho   Provider: Dr. French Ana McLean-Scocuzza-Internal Medicine

## 2021-05-28 NOTE — Patient Instructions (Addendum)
Dr. Mayra Neer clinci  Phone Fax E-mail Address  [phone] Not available [address]   [place] [address]     Specialties     Gastroenterology       High Cholesterol High cholesterol is a condition in which the blood has high levels of a white, waxy substance similar to fat (cholesterol). The liver makes all the cholesterol that the body needs. The human body needs small amounts of cholesterol to help build cells. A person gets extra or excess cholesterol from the food that he or she eats. The blood carries cholesterol from the liver to the rest of the body. If you have high cholesterol, deposits (plaques) may build up on the walls of your arteries. Arteries are the blood vessels that carry blood away from your heart. These plaques make the arteries narrow and stiff. Cholesterol plaques increase your risk for heart attack and stroke. Work with your health care provider to keep your cholesterol levels in a healthy range. What increases the risk? The following factors may make you more likely to develop this condition: Eating foods that are high in animal fat (saturated fat) or cholesterol. Being overweight. Not getting enough exercise. A family history of high cholesterol (familial hypercholesterolemia). Use of tobacco products. Having diabetes. What are the signs or symptoms? In most cases, high cholesterol does not usually cause any symptoms. In severe cases, very high cholesterol levels can cause: Fatty bumps under the skin (xanthomas). A white or gray ring around the black center (pupil) of the eye. How is this diagnosed? This condition may be diagnosed based on the results of a blood test. If you are older than 53 years of age, your health care provider may check your cholesterol levels every 4-6 years. You may be checked more often if you have high cholesterol or other risk factors for heart disease. The blood test for cholesterol  measures: "Bad" cholesterol, or LDL cholesterol. This is the main type of cholesterol that causes heart disease. The desired level is less than 100 mg/dL (4.65 mmol/L). "Good" cholesterol, or HDL cholesterol. HDL helps protect against heart disease by cleaning the arteries and carrying the LDL to the liver for processing. The desired level for HDL is 60 mg/dL (0.35 mmol/L) or higher. Triglycerides. These are fats that your body can store or burn for energy. The desired level is less than 150 mg/dL (4.65 mmol/L). Total cholesterol. This measures the total amount of cholesterol in your blood and includes LDL, HDL, and triglycerides. The desired level is less than 200 mg/dL (6.81 mmol/L). How is this treated? Treatment for high cholesterol starts with lifestyle changes, such as diet and exercise. Diet changes. You may be asked to eat foods that have more fiber and less saturated fats or added sugar. Lifestyle changes. These may include regular exercise, maintaining a healthy weight, and quitting use of tobacco products. Medicines. These are given when diet and lifestyle changes have not worked. You may be prescribed a statin medicine to help lower your cholesterol levels. Follow these instructions at home: Eating and drinking  Eat a healthy, balanced diet. This diet includes: Daily servings of a variety of fresh, frozen, or canned fruits and vegetables. Daily servings of whole grain foods that are rich in fiber. Foods that are low in saturated fats and trans fats. These include poultry and fish without skin, lean cuts of meat, and low-fat dairy products. A variety of fish, especially oily fish that contain omega-3 fatty acids. Aim  to eat fish at least 2 times a week. Avoid foods and drinks that have added sugar. Use healthy cooking methods, such as roasting, grilling, broiling, baking, poaching, steaming, and stir-frying. Do not fry your food except for stir-frying. If you drink alcohol: Limit how  much you have to: 0-1 drink a day for women who are not pregnant. 0-2 drinks a day for men. Know how much alcohol is in a drink. In the [place] one drink equals one 12 oz bottle of beer (355 mL), one 5 oz glass of wine (148 mL), or one 1 oz glass of hard liquor (44 mL). Lifestyle  Get regular exercise. Aim to exercise for a total of 150 minutes a week. Increase your activity level by doing activities such as gardening, walking, and taking the stairs. Do not use any products that contain nicotine or tobacco. These products include cigarettes, chewing tobacco, and vaping devices, such as e-cigarettes. If you need help quitting, ask your health care provider. General instructions Take over-the-counter and prescription medicines only as told by your health care provider. Keep all follow-up visits. This is important. Where to find more information American Heart Association: www.heart.org National Heart, Lung, and Blood Institute: PopSteam.is Contact a health care provider if: You have trouble achieving or maintaining a healthy diet or weight. You are starting an exercise program. You are unable to stop smoking. Get help right away if: You have chest pain. You have trouble breathing. You have discomfort or pain in your jaw, neck, back, shoulder, or arm. You have any symptoms of a stroke. "BE FAST" is an easy way to remember the main warning signs of a stroke: B - Balance. Signs are dizziness, sudden trouble walking, or loss of balance. E - Eyes. Signs are trouble seeing or a sudden change in vision. F - Face. Signs are sudden weakness or numbness of the face, or the face or eyelid drooping on one side. A - Arms. Signs are weakness or numbness in an arm. This happens suddenly and usually on one side of the body. S - Speech. Signs are sudden trouble speaking, slurred speech, or trouble understanding what people say. T - Time. Time to call emergency services. Write down what time symptoms  started. You have other signs of a stroke, such as: A sudden, severe headache with no known cause. Nausea or vomiting. Seizure. These symptoms may represent a serious problem that is an emergency. Do not wait to see if the symptoms will go away. Get medical help right away. Call your local emergency services (911 in the [place] Do not drive yourself to the hospital. Summary Cholesterol plaques increase your risk for heart attack and stroke. Work with your health care provider to keep your cholesterol levels in a healthy range. Eat a healthy, balanced diet, get regular exercise, and maintain a healthy weight. Do not use any products that contain nicotine or tobacco. These products include cigarettes, chewing tobacco, and vaping devices, such as e-cigarettes. Get help right away if you have any symptoms of a stroke. This information is not intended to replace advice given to you by your health care provider. Make sure you discuss any questions you have with your health care provider. Document Revised: 07/30/2020 Document Reviewed: 07/20/2020 Elsevier Patient Education  2022 [employer]  Cholesterol Content in Foods Cholesterol is a waxy, fat-like substance that helps to carry fat in the blood. The body needs cholesterol in small amounts, but too much cholesterol can cause damage to the arteries and  heart. What foods have cholesterol? Cholesterol is found in animal-based foods, such as meat, seafood, and dairy. Generally, low-fat dairy and lean meats have less cholesterol than full-fat dairy and fatty meats. The milligrams of cholesterol per serving (mg per serving) of common cholesterol-containing foods are listed below. Meats and other proteins Egg -- one large whole egg has 186 mg. Veal shank -- 4 oz (113 g) has 141 mg. Lean ground [place] (93% lean) -- 4 oz (113 g) has 118 mg. Fat-trimmed lamb loin -- 4 oz (113 g) has 106 mg. Lean ground beef (90% lean) -- 4 oz (113 g) has 100 mg. Lobster  -- 3.5 oz (99 g) has 90 mg. Pork loin chops -- 4 oz (113 g) has 86 mg. Canned salmon -- 3.5 oz (99 g) has 83 mg. Fat-trimmed beef top loin -- 4 oz (113 g) has 78 mg. Frankfurter -- 1 frank (3.5 oz or 99 g) has 77 mg. Crab -- 3.5 oz (99 g) has 71 mg. Roasted chicken without skin, white meat -- 4 oz (113 g) has 66 mg. Light bologna -- 2 oz (57 g) has 45 mg. Deli-cut [place] -- 2 oz (57 g) has 31 mg. Canned tuna -- 3.5 oz (99 g) has 31 mg. Tomasa Blase -- 1 oz (28 g) has 29 mg. Oysters and mussels (raw) -- 3.5 oz (99 g) has 25 mg. Mackerel -- 1 oz (28 g) has 22 mg. Trout -- 1 oz (28 g) has 20 mg. Pork sausage -- 1 link (1 oz or 28 g) has 17 mg. Salmon -- 1 oz (28 g) has 16 mg. Tilapia -- 1 oz (28 g) has 14 mg. Dairy Soft-serve ice cream --  cup (4 oz or 86 g) has 103 mg. Whole-milk yogurt -- 1 cup (8 oz or 245 g) has 29 mg. Cheddar cheese -- 1 oz (28 g) has 28 mg. American cheese -- 1 oz (28 g) has 28 mg. Whole milk -- 1 cup (8 oz or 250 mL) has 23 mg. 2% milk -- 1 cup (8 oz or 250 mL) has 18 mg. Cream cheese -- 1 tablespoon (Tbsp) (14.5 g) has 15 mg. Cottage cheese --  cup (4 oz or 113 g) has 14 mg. Low-fat (1%) milk -- 1 cup (8 oz or 250 mL) has 10 mg. Sour cream -- 1 Tbsp (12 g) has 8.5 mg. Low-fat yogurt -- 1 cup (8 oz or 245 g) has 8 mg. Nonfat Greek yogurt -- 1 cup (8 oz or 228 g) has 7 mg. Half-and-half cream -- 1 Tbsp (15 mL) has 5 mg. Fats and oils Cod liver oil -- 1 tablespoon (Tbsp) (13.6 g) has 82 mg. Butter -- 1 Tbsp (14 g) has 15 mg. Lard -- 1 Tbsp (12.8 g) has 14 mg. Bacon grease -- 1 Tbsp (12.9 g) has 14 mg. Mayonnaise -- 1 Tbsp (13.8 g) has 5-10 mg. Margarine -- 1 Tbsp (14 g) has 3-10 mg. The items listed above may not be a complete list of foods with cholesterol. Exact amounts of cholesterol in these foods may vary depending on specific ingredients and brands. Contact a dietitian for more information. What foods do not have cholesterol? Most plant-based foods do not  have cholesterol unless you combine them with a food that has cholesterol. Foods without cholesterol include: Grains and cereals. Vegetables. Fruits. Vegetable oils, such as olive, canola, and sunflower oil. Legumes, such as peas, beans, and lentils. Nuts and seeds. Egg whites. The items listed above may  not be a complete list of foods that do not have cholesterol. Contact a dietitian for more information. Summary The body needs cholesterol in small amounts, but too much cholesterol can cause damage to the arteries and heart. Cholesterol is found in animal-based foods, such as meat, seafood, and dairy. Generally, low-fat dairy and lean meats have less cholesterol than full-fat dairy and fatty meats. This information is not intended to replace advice given to you by your health care provider. Make sure you discuss any questions you have with your health care provider. Document Revised: 09/25/2020 Document Reviewed: 09/25/2020 Elsevier Patient Education  2022 [employer] Inc.  Prediabetes Eating Plan Prediabetes is a condition that causes blood sugar (glucose) levels to be higher than normal. This increases the risk for developing type 2 diabetes (type 2 diabetes mellitus). Working with a health care provider or nutrition specialist (dietitian) to make diet and lifestyle changes can help prevent the onset of diabetes. These changes may help you: Control your blood glucose levels. Improve your cholesterol levels. Manage your blood pressure. What are tips for following this plan? Reading food labels Read food labels to check the amount of fat, salt (sodium), and sugar in prepackaged foods. Avoid foods that have: Saturated fats. Trans fats. Added sugars. Avoid foods that have more than 300 milligrams (mg) of sodium per serving. Limit your sodium intake to less than 2,300 mg each day. Shopping Avoid buying pre-made and processed foods. Avoid buying drinks with added sugar. Cooking Cook with  olive oil. Do not use butter, lard, or ghee. Bake, broil, grill, steam, or boil foods. Avoid frying. Meal planning  Work with your dietitian to create an eating plan that is right for you. This may include tracking how many calories you take in each day. Use a food diary, notebook, or mobile application to track what you eat at each meal. Consider following a Mediterranean diet. This includes: Eating several servings of fresh fruits and vegetables each day. Eating fish at least twice a week. Eating one serving each day of whole grains, beans, nuts, and seeds. Using olive oil instead of other fats. Limiting alcohol. Limiting red meat. Using nonfat or low-fat dairy products. Consider following a plant-based diet. This includes dietary choices that focus on eating mostly vegetables and fruit, grains, beans, nuts, and seeds. If you have high blood pressure, you may need to limit your sodium intake or follow a diet such as the DASH (Dietary Approaches to Stop Hypertension) eating plan. The DASH diet aims to lower high blood pressure. Lifestyle Set weight loss goals with help from your health care team. It is recommended that most people with prediabetes lose 7% of their body weight. Exercise for at least 30 minutes 5 or more days a week. Attend a support group or seek support from a mental health counselor. Take over-the-counter and prescription medicines only as told by your health care provider. What foods are recommended? Fruits Berries. Bananas. Apples. Oranges. Grapes. Papaya. Mango. Pomegranate. Kiwi. Grapefruit. Cherries. Vegetables Lettuce. Spinach. Peas. Beets. Cauliflower. Cabbage. Broccoli. Carrots. Tomatoes. Squash. Eggplant. Herbs. Peppers. Onions. Cucumbers. Brussels sprouts. Grains Whole grains, such as whole-wheat or whole-grain breads, crackers, cereals, and pasta. Unsweetened oatmeal. Bulgur. Barley. Quinoa. Brown rice. Corn or whole-wheat flour tortillas or taco shells. Meats  and other proteins Seafood. Poultry without skin. Lean cuts of pork and beef. Tofu. Eggs. Nuts. Beans. Dairy Low-fat or fat-free dairy products, such as yogurt, cottage cheese, and cheese. Beverages Water. Tea. Coffee. Sugar-free or diet soda. Seltzer  water. Low-fat or nonfat milk. Milk alternatives, such as soy or almond milk. Fats and oils Olive oil. Canola oil. Sunflower oil. Grapeseed oil. Avocado. Walnuts. Sweets and desserts Sugar-free or low-fat pudding. Sugar-free or low-fat ice cream and other frozen treats. Seasonings and condiments Herbs. Sodium-free spices. Mustard. Relish. Low-salt, low-sugar ketchup. Low-salt, low-sugar barbecue sauce. Low-fat or fat-free mayonnaise. The items listed above may not be a complete list of recommended foods and beverages. Contact a dietitian for more information. What foods are not recommended? Fruits Fruits canned with syrup. Vegetables Canned vegetables. Frozen vegetables with butter or cream sauce. Grains Refined white flour and flour products, such as bread, pasta, snack foods, and cereals. Meats and other proteins Fatty cuts of meat. Poultry with skin. Breaded or fried meat. Processed meats. Dairy Full-fat yogurt, cheese, or milk. Beverages Sweetened drinks, such as iced tea and soda. Fats and oils Butter. Lard. Ghee. Sweets and desserts Baked goods, such as cake, cupcakes, pastries, cookies, and cheesecake. Seasonings and condiments Spice mixes with added salt. Ketchup. Barbecue sauce. Mayonnaise. The items listed above may not be a complete list of foods and beverages that are not recommended. Contact a dietitian for more information. Where to find more information American Diabetes Association: www.diabetes.org Summary You may need to make diet and lifestyle changes to help prevent the onset of diabetes. These changes can help you control blood sugar, improve cholesterol levels, and manage blood pressure. Set weight loss goals  with help from your health care team. It is recommended that most people with prediabetes lose 7% of their body weight. Consider following a Mediterranean diet. This includes eating plenty of fresh fruits and vegetables, whole grains, beans, nuts, seeds, fish, and low-fat dairy, and using olive oil instead of other fats. This information is not intended to replace advice given to you by your health care provider. Make sure you discuss any questions you have with your health care provider. Document Revised: 08/15/2019 Document Reviewed: 08/15/2019 Elsevier Patient Education  2022 [employer].

## 2021-05-29 LAB — BASIC METABOLIC PANEL WITH GFR
BUN: 13 mg/dL (ref 7–25)
CO2: 25 mmol/L (ref 20–32)
Calcium: 9.5 mg/dL (ref 8.6–10.4)
Chloride: 101 mmol/L (ref 98–110)
Creat: 0.74 mg/dL (ref 0.50–1.03)
Glucose, Bld: 94 mg/dL (ref 65–99)
Potassium: 4.5 mmol/L (ref 3.5–5.3)
Sodium: 138 mmol/L (ref 135–146)
eGFR: 97 mL/min/{1.73_m2} (ref >=60)

## 2021-06-01 ENCOUNTER — Encounter: Payer: Self-pay | Admitting: Internal Medicine

## 2021-09-01 ENCOUNTER — Telehealth: Payer: Self-pay | Admitting: *Deleted

## 2021-09-01 NOTE — Telephone Encounter (Signed)
Please place future orders for lab appt.  

## 2021-09-06 ENCOUNTER — Other Ambulatory Visit (INDEPENDENT_AMBULATORY_CARE_PROVIDER_SITE_OTHER): Payer: BC Managed Care – PPO

## 2021-09-06 DIAGNOSIS — R7303 Prediabetes: Secondary | ICD-10-CM

## 2021-09-06 DIAGNOSIS — E559 Vitamin D deficiency, unspecified: Secondary | ICD-10-CM

## 2021-09-06 DIAGNOSIS — I10 Essential (primary) hypertension: Secondary | ICD-10-CM | POA: Diagnosis not present

## 2021-09-06 LAB — HEMOGLOBIN A1C: Hgb A1c MFr Bld: 5.9 % (ref 4.6–6.5)

## 2021-09-06 LAB — COMPREHENSIVE METABOLIC PANEL
ALT: 15 U/L (ref 0–35)
AST: 15 U/L (ref 0–37)
Albumin: 3.9 g/dL (ref 3.5–5.2)
Alkaline Phosphatase: 62 U/L (ref 39–117)
BUN: 17 mg/dL (ref 6–23)
CO2: 25 mEq/L (ref 19–32)
Calcium: 9.4 mg/dL (ref 8.4–10.5)
Chloride: 105 mEq/L (ref 96–112)
Creatinine, Ser: 0.77 mg/dL (ref 0.40–1.20)
GFR: 87.9 mL/min (ref >60.00)
Glucose, Bld: 94 mg/dL (ref 70–99)
Potassium: 3.9 mEq/L (ref 3.5–5.1)
Sodium: 139 mEq/L (ref 135–145)
Total Bilirubin: 0.4 mg/dL (ref 0.2–1.2)
Total Protein: 7.8 g/dL (ref 6.0–8.3)

## 2021-09-06 LAB — LIPID PANEL
Cholesterol: 178 mg/dL (ref 0–200)
HDL: 64.6 mg/dL (ref >39.00)
LDL Cholesterol: 100 mg/dL — ABNORMAL HIGH (ref 0–99)
NonHDL: 113.58
Total CHOL/HDL Ratio: 3
Triglycerides: 70 mg/dL (ref 0.0–149.0)
VLDL: 14 mg/dL (ref 0.0–40.0)

## 2021-09-06 LAB — CBC WITH DIFFERENTIAL/PLATELET
Basophils Absolute: 0.2 10*3/uL — ABNORMAL HIGH (ref 0.0–0.1)
Basophils Relative: 2.8 % (ref 0.0–3.0)
Eosinophils Absolute: 0.1 10*3/uL (ref 0.0–0.7)
Eosinophils Relative: 2.4 % (ref 0.0–5.0)
HCT: 40.3 % (ref 36.0–46.0)
Hemoglobin: 13.5 g/dL (ref 12.0–15.0)
Lymphocytes Relative: 44.2 % (ref 12.0–46.0)
Lymphs Abs: 2.5 10*3/uL (ref 0.7–4.0)
MCHC: 33.6 g/dL (ref 30.0–36.0)
MCV: 93.3 fl (ref 78.0–100.0)
Monocytes Absolute: 0.4 10*3/uL (ref 0.1–1.0)
Monocytes Relative: 7.9 % (ref 3.0–12.0)
Neutro Abs: 2.4 10*3/uL (ref 1.4–7.7)
Neutrophils Relative %: 42.7 % — ABNORMAL LOW (ref 43.0–77.0)
Platelets: 309 10*3/uL (ref 150.0–400.0)
RBC: 4.32 Mil/uL (ref 3.87–5.11)
RDW: 13.9 % (ref 11.5–15.5)
WBC: 5.7 10*3/uL (ref 4.0–10.5)

## 2021-09-06 LAB — VITAMIN D 25 HYDROXY (VIT D DEFICIENCY, FRACTURES): VITD: 33.64 ng/mL (ref 30.00–100.00)

## 2021-09-08 ENCOUNTER — Encounter: Payer: Self-pay | Admitting: Internal Medicine

## 2021-09-08 ENCOUNTER — Ambulatory Visit (INDEPENDENT_AMBULATORY_CARE_PROVIDER_SITE_OTHER): Payer: BC Managed Care – PPO | Admitting: Internal Medicine

## 2021-09-08 VITALS — BP 134/84 | HR 86 | Temp 98.6°F | Resp 14 | Ht 65.0 in | Wt 289.0 lb

## 2021-09-08 DIAGNOSIS — R7303 Prediabetes: Secondary | ICD-10-CM

## 2021-09-08 DIAGNOSIS — J0111 Acute recurrent frontal sinusitis: Secondary | ICD-10-CM

## 2021-09-08 DIAGNOSIS — Z6841 Body Mass Index (BMI) 40.0 and over, adult: Secondary | ICD-10-CM

## 2021-09-08 DIAGNOSIS — J309 Allergic rhinitis, unspecified: Secondary | ICD-10-CM

## 2021-09-08 DIAGNOSIS — I10 Essential (primary) hypertension: Secondary | ICD-10-CM

## 2021-09-08 MED ORDER — FLUTICASONE PROPIONATE 50 MCG/ACT NA SUSP: 2.0000 | Freq: Every day | NASAL | 6 refills | Status: DC

## 2021-09-08 MED ORDER — AMOXICILLIN-POT CLAVULANATE 875-125 MG PO TABS: 1.0000 | ORAL_TABLET | Freq: Two times a day (BID) | ORAL | 0 refills | Status: DC

## 2021-09-08 MED ORDER — SALINE SPRAY 0.65 % NA SOLN: 2.0000 | Freq: Every day | NASAL | 12 refills | Status: DC | PRN

## 2021-09-08 MED ORDER — MONTELUKAST SODIUM 10 MG PO TABS: 10.0000 mg | ORAL_TABLET | Freq: Every day | ORAL | 0 refills | Status: DC

## 2021-09-08 NOTE — Patient Instructions (Addendum)
Nasal saline 2 sprays, flonase zyrtec and singulair  ? ?Call insurance and consider wegovy dont qualify phentermine due to blood pressure elevation  ? ? ?Consider shingrix shingles vaccines call for nurse visit ?Zoster Vaccine, Recombinant injection ?What is this medication? ?ZOSTER VACCINE (ZOS ter vak SEEN) is a vaccine used to reduce the risk of getting shingles. This vaccine is not used to treat shingles or nerve pain from shingles. ?This medicine may be used for other purposes; ask your health care provider or pharmacist if you have questions. ?COMMON BRAND NAME(S): SHINGRIX ?What should I tell my care team before I take this medication? ?They need to know if you have any of these conditions: ?cancer ?immune system problems ?an unusual or allergic reaction to Zoster vaccine, other medications, foods, dyes, or preservatives ?pregnant or trying to get pregnant ?breast-feeding ?How should I use this medication? ?This vaccine is injected into a muscle. It is given by a health care provider. ?A copy of Vaccine Information Statements will be given before each vaccination. Be sure to read this information carefully each time. This sheet may change often. ?Talk to your health care provider about the use of this vaccine in children. This vaccine is not approved for use in children. ?Overdosage: If you think you have taken too much of this medicine contact a poison control center or emergency room at once. ?NOTE: This medicine is only for you. Do not share this medicine with others. ?What if I miss a dose? ?Keep appointments for follow-up (booster) doses. It is important not to miss your dose. Call your health care provider if you are unable to keep an appointment. ?What may interact with this medication? ?medicines that suppress your immune system ?medicines to treat cancer ?steroid medicines like prednisone or cortisone ?This list may not describe all possible interactions. Give your health care provider a list of all  the medicines, herbs, non-prescription drugs, or dietary supplements you use. Also tell them if you smoke, drink alcohol, or use illegal drugs. Some items may interact with your medicine. ?What should I watch for while using this medication? ?Visit your health care provider regularly. ?This vaccine, like all vaccines, may not fully protect everyone. ?What side effects may I notice from receiving this medication? ?Side effects that you should report to your doctor or health care professional as soon as possible: ?allergic reactions (skin rash, itching or hives; swelling of the face, lips, or tongue) ?trouble breathing ?Side effects that usually do not require medical attention (report these to your doctor or health care professional if they continue or are bothersome): ?chills ?headache ?fever ?nausea ?pain, redness, or irritation at site where injected ?tiredness ?vomiting ?This list may not describe all possible side effects. Call your doctor for medical advice about side effects. You may report side effects to FDA at 1-800-FDA-1088. ?Where should I keep my medication? ?This vaccine is only given by a health care provider. It will not be stored at home. ?NOTE: This sheet is a summary. It may not cover all possible information. If you have questions about this medicine, talk to your doctor, pharmacist, or health care provider. ?? 2022 Elsevier/Gold Standard (2021-02-02 00:00:00) ? ?Semaglutide Injection (Weight Management) ?What is this medication? ?SEMAGLUTIDE (SEM a GLOO tide) promotes weight loss. It may also be used to maintain weight loss. It works by decreasing appetite. Changes to diet and exercise are often combined with this medication. ?This medicine may be used for other purposes; ask your health care provider or pharmacist if  you have questions. ?COMMON BRAND NAME(S): Wegovy ?What should I tell my care team before I take this medication? ?They need to know if you have any of these conditions: ?Endocrine  tumors (MEN 2) or if someone in your family had these tumors ?Eye disease, vision problems ?Gallbladder disease ?History of depression or mental health disease ?History of pancreatitis ?Kidney disease ?Stomach or intestine problems ?Suicidal thoughts, plans, or attempt; a previous suicide attempt by you or a family member ?Thyroid cancer or if someone in your family had thyroid cancer ?An unusual or allergic reaction to semaglutide, other medications, foods, dyes, or preservatives ?Pregnant or trying to get pregnant ?Breast-feeding ?How should I use this medication? ?This medication is injected under the skin. You will be taught how to prepare and give it. Take it as directed on the prescription label. It is given once every week (every 7 days). Keep taking it unless your care team tells you to stop. ?It is important that you put your used needles and pens in a special sharps container. Do not put them in a trash can. If you do not have a sharps container, call your pharmacist or care team to get one. ?A special MedGuide will be given to you by the pharmacist with each prescription and refill. Be sure to read this information carefully each time. ?This medication comes with INSTRUCTIONS FOR USE. Ask your pharmacist for directions on how to use this medication. Read the information carefully. Talk to your pharmacist or care team if you have questions. ?Talk to your care team about the use of this medication in children. Special care may be needed. ?Overdosage: If you think you have taken too much of this medicine contact a poison control center or emergency room at once. ?NOTE: This medicine is only for you. Do not share this medicine with others. ?What if I miss a dose? ?If you miss a dose and the next scheduled dose is more than 2 days away, take the missed dose as soon as possible. If you miss a dose and the next scheduled dose is less than 2 days away, do not take the missed dose. Take the next dose at your  regular time. Do not take double or extra doses. If you miss your dose for 2 weeks or more, take the next dose at your regular time or call your care team to talk about how to restart this medication. ?What may interact with this medication? ?Insulin and other medications for diabetes ?This list may not describe all possible interactions. Give your health care provider a list of all the medicines, herbs, non-prescription drugs, or dietary supplements you use. Also tell them if you smoke, drink alcohol, or use illegal drugs. Some items may interact with your medicine. ?What should I watch for while using this medication? ?Visit your care team for regular checks on your progress. It may be some time before you see the benefit from this medication. ?Drink plenty of fluids while taking this medication. Check with your care team if you have severe diarrhea, nausea, and vomiting, or if you sweat a lot. The loss of too much body fluid may make it dangerous for you to take this medication. ?This medication may affect blood sugar levels. Ask your care team if changes in diet or medications are needed if you have diabetes. ?If you or your family notice any changes in your behavior, such as new or worsening depression, thoughts of harming yourself, anxiety, other unusual or disturbing thoughts, or  memory loss, call your care team right away. ?Women should inform their care team if they wish to become pregnant or think they might be pregnant. Losing weight while pregnant is not advised and may cause harm to the unborn child. Talk to your care team for more information. ?What side effects may I notice from receiving this medication? ?Side effects that you should report to your care team as soon as possible: ?Allergic reactions--skin rash, itching, hives, swelling of the face, lips, tongue, or throat ?Change in vision ?Dehydration--increased thirst, dry mouth, feeling faint or lightheaded, headache, dark yellow or brown  urine ?Gallbladder problems--severe stomach pain, nausea, vomiting, fever ?Heart palpitations--rapid, pounding, or irregular heartbeat ?Kidney injury--decrease in the amount of urine, swelling of the ankles, hands,

## 2021-09-08 NOTE — Progress Notes (Signed)
Chief Complaint  ?Patient presents with  ? Follow-up  ?  No major concerns besides discussing her sinuses, she states they been hurting everyday, more pressure like.   ? ?F/u  ?1. Htn improved on hyzaar 50-12.5 mg qd  ?2. C/o frontal sinus pressure and pain daily h/o allergies tried otc decongestant and zyrtec still wit hpain  ?3. Obesity and prediabetes will check with insurance if cover wegovy  ? ? ?Review of Systems  ?Constitutional:  Negative for weight loss.  ?HENT:  Positive for sinus pain. Negative for hearing loss.   ?Eyes:  Negative for blurred vision.  ?Respiratory:  Negative for shortness of breath.   ?Cardiovascular:  Negative for chest pain.  ?Gastrointestinal:  Negative for abdominal pain and blood in stool.  ?Genitourinary:  Negative for dysuria.  ?Musculoskeletal:  Negative for falls and joint pain.  ?Skin:  Negative for rash.  ?Neurological:  Negative for headaches.  ?Psychiatric/Behavioral:  Negative for depression.   ?Past Medical History:  ?Diagnosis Date  ? Alopecia areata   ? COVID-19   ? 2020  ? Sinusitis   ? Trigger finger of right thumb   ? ?Past Surgical History:  ?Procedure Laterality Date  ? BREAST REDUCTION SURGERY Bilateral 05/30/1997  ? CESAREAN SECTION  05/31/1995  ? x1  ? CHOLECYSTECTOMY    ? CYSTOSCOPY  06/19/2015  ? Procedure: CYSTOSCOPY;  Surgeon: Lorette Ang, MD;  Location: [facility];  Service: Gynecology;;  ? KNEE ARTHROSCOPY Left 12/19/2019  ? Procedure: Arthroscopic partial lateral meniscectomy, abrasion chondroplasty of grade 3 chondromalacial changes of femoral trochlea and lateral tibial plateau, debridement of symptomatic plica, and subchondroplasty of subchondral stress fracture of lateral tibial plateau, left knee;  Surgeon: Corky Mull, MD;  Location: [facility];  Service: Orthopedics;  Laterality: Left;  ? LAPAROSCOPIC HYSTERECTOMY Bilateral 06/19/2015  ? Procedure: HYSTERECTOMY TOTAL LAPAROSCOPIC/BILATERAL SALPINGECTOMY/LABIAL CYST EXCISION;  Surgeon: Lorette Ang, MD;  Location: [facility];  Service: Gynecology;  Laterality: Bilateral;  ? right thumb trigger finger    ? ?Family History  ?Problem Relation Age of Onset  ? Stroke Mother   ? Hyperlipidemia Mother   ? Arthritis Mother   ? COPD Mother   ? Heart attack Mother   ? Heart disease Mother   ? Alcohol abuse Father   ? Asthma Son   ? ?Social History  ? ?Socioeconomic History  ? Marital status: Divorced  ?  Spouse name: Not on file  ? Number of children: Not on file  ? Years of education: Not on file  ? Highest education level: Not on file  ?Occupational History  ? Not on file  ?Tobacco Use  ? Smoking status: Never  ? Smokeless tobacco: Never  ?Vaping Use  ? Vaping Use: Never used  ?Substance and Sexual Activity  ? Alcohol use: No  ? Drug use: No  ? Sexual activity: Not on file  ?Other Topics Concern  ? Not on file  ?Social History Narrative  ? 1 son   ? 1 daughter  ? [employer] teacher  ? 3 pregnancies 2 live births son and daughter  ? ?Social Determinants of Health  ? ?Financial Resource Strain: Not on file  ?Food Insecurity: Not on file  ?Transportation Needs: Not on file  ?Physical Activity: Not on file  ?Stress: Not on file  ?Social Connections: Not on file  ?Intimate Partner Violence: Not on file  ? ?Current Meds  ?Medication Sig  ? amoxicillin-clavulanate (AUGMENTIN) 875-125 MG tablet Take 1 tablet  by mouth 2 (two) times daily. With food  ? Blood Pressure KIT 1 Device by Does not apply route daily. I10 large arm size omron  ? Cyanocobalamin (VITAMIN B 12 PO) Take 1 tablet by mouth every morning.  ? fluticasone (FLONASE) 50 MCG/ACT nasal spray Place 2 sprays into both nostrils daily.  ? Ginger, Zingiber officinalis, (GINGER EXTRACT PO) Take 1 capsule by mouth 3 (three) times daily.  ? losartan-hydrochlorothiazide (HYZAAR) 50-12.5 MG tablet 1/2 pill in the am x 3 day sand 1 pill day 4 and beyond  ? montelukast (SINGULAIR) 10 MG tablet Take 1 tablet (10 mg total) by mouth at bedtime.  ? sodium chloride (OCEAN) 0.65 % SOLN  nasal spray Place 2 sprays into both nostrils daily as needed for congestion. Followed by flonase 2 sprays daily prn  ? ?Allergies  ?Allergen Reactions  ? Honey Bee Venom [Bee Venom] Shortness Of Breath, Swelling and Anxiety  ? Peanuts [Peanut Oil] Anaphylaxis and Hives  ? Cinnamon Cough  ?  sneeze  ? Dust Mite Extract Itching and Swelling  ?  Sneezing, swollen and itchy eyes  ? ?Recent Results (from the past 2160 hour(s))  ?Vitamin D (25 hydroxy)     Status: None  ? Collection Time: 09/06/21  7:37 AM  ?Result Value Ref Range  ? VITD 33.64 30.00 - 100.00 ng/mL  ?Hemoglobin A1c     Status: None  ? Collection Time: 09/06/21  7:37 AM  ?Result Value Ref Range  ? Hgb A1c MFr Bld 5.9 4.6 - 6.5 %  ?  Comment: Glycemic Control Guidelines for People with Diabetes:Non Diabetic:  <6%Goal of Therapy: <7%Additional Action Suggested:  >8%   ?CBC w/Diff     Status: Abnormal  ? Collection Time: 09/06/21  7:37 AM  ?Result Value Ref Range  ? WBC 5.7 4.0 - 10.5 K/uL  ? RBC 4.32 3.87 - 5.11 Mil/uL  ? Hemoglobin 13.5 12.0 - 15.0 g/dL  ? HCT 40.3 36.0 - 46.0 %  ? MCV 93.3 78.0 - 100.0 fl  ? MCHC 33.6 30.0 - 36.0 g/dL  ? RDW 13.9 11.5 - 15.5 %  ? Platelets 309.0 150.0 - 400.0 K/uL  ? Neutrophils Relative % 42.7 (L) 43.0 - 77.0 %  ? Lymphocytes Relative 44.2 12.0 - 46.0 %  ? Monocytes Relative 7.9 3.0 - 12.0 %  ? Eosinophils Relative 2.4 0.0 - 5.0 %  ? Basophils Relative 2.8 0.0 - 3.0 %  ? Neutro Abs 2.4 1.4 - 7.7 K/uL  ? Lymphs Abs 2.5 0.7 - 4.0 K/uL  ? Monocytes Absolute 0.4 0.1 - 1.0 K/uL  ? Eosinophils Absolute 0.1 0.0 - 0.7 K/uL  ? Basophils Absolute 0.2 (H) 0.0 - 0.1 K/uL  ?Lipid panel     Status: Abnormal  ? Collection Time: 09/06/21  7:37 AM  ?Result Value Ref Range  ? Cholesterol 178 0 - 200 mg/dL  ?  Comment: ATP III Classification       Desirable:  < 200 mg/dL               Borderline High:  200 - 239 mg/dL          High:  > = 240 mg/dL  ? Triglycerides 70.0 0.0 - 149.0 mg/dL  ?  Comment: Normal:  <150 mg/dLBorderline High:  150  - 199 mg/dL  ? HDL 64.60 >39.00 mg/dL  ? VLDL 14.0 0.0 - 40.0 mg/dL  ? LDL Cholesterol 100 (H) 0 - 99 mg/dL  ? Total  CHOL/HDL Ratio 3   ?  Comment:                Men          Women1/2 Average Risk     3.4          3.3Average Risk          5.0          4.42X Average Risk          9.6          7.13X Average Risk          15.0          11.0                      ? NonHDL 113.58   ?  Comment: NOTE:  Non-HDL goal should be 30 mg/dL higher than patient's LDL goal (i.e. LDL goal of < 70 mg/dL, would have non-HDL goal of < 100 mg/dL)  ?Comprehensive metabolic panel     Status: None  ? Collection Time: 09/06/21  7:37 AM  ?Result Value Ref Range  ? Sodium 139 135 - 145 mEq/L  ? Potassium 3.9 3.5 - 5.1 mEq/L  ? Chloride 105 96 - 112 mEq/L  ? CO2 25 19 - 32 mEq/L  ? Glucose, Bld 94 70 - 99 mg/dL  ? BUN 17 6 - 23 mg/dL  ? Creatinine, Ser 0.77 0.40 - 1.20 mg/dL  ? Total Bilirubin 0.4 0.2 - 1.2 mg/dL  ? Alkaline Phosphatase 62 39 - 117 U/L  ? AST 15 0 - 37 U/L  ? ALT 15 0 - 35 U/L  ? Total Protein 7.8 6.0 - 8.3 g/dL  ? Albumin 3.9 3.5 - 5.2 g/dL  ? GFR 87.90 >60.00 mL/min  ?  Comment: Calculated using the CKD-EPI Creatinine Equation (2021)  ? Calcium 9.4 8.4 - 10.5 mg/dL  ? ?Objective  ?Body mass index is 48.09 kg/m?. ?Wt Readings from Last 3 Encounters:  ?09/08/21 289 lb (131.1 kg)  ?05/28/21 291 lb (132 kg)  ?05/19/21 292 lb 9.6 oz (132.7 kg)  ? ?Temp Readings from Last 3 Encounters:  ?09/08/21 98.6 ?F (37 ?C) (Oral)  ?05/28/21 98 ?F (36.7 ?C)  ?05/19/21 97.6 ?F (36.4 ?C) (Temporal)  ? ?BP Readings from Last 3 Encounters:  ?09/08/21 134/84  ?05/28/21 136/84  ?05/19/21 (!) 160/110  ? ?Pulse Readings from Last 3 Encounters:  ?09/08/21 86  ?05/28/21 69  ?05/19/21 92  ? ? ?Physical Exam ?Vitals and nursing note reviewed.  ?Constitutional:   ?   Appearance: Normal appearance. She is well-developed and well-groomed.  ?HENT:  ?   Head: Normocephalic and atraumatic.  ?Eyes:  ?   Conjunctiva/sclera: Conjunctivae normal.  ?   Pupils:  Pupils are equal, round, and reactive to light.  ?Cardiovascular:  ?   Rate and Rhythm: Normal rate and regular rhythm.  ?   Heart sounds: Normal heart sounds. No murmur heard. ?Pulmonary:  ?   Effort: Pu

## 2021-09-15 ENCOUNTER — Telehealth: Payer: Self-pay | Admitting: Internal Medicine

## 2021-09-15 NOTE — Telephone Encounter (Signed)
Pt called in to get update on PA for weight loss medication... Pt was wondering if Dr. Olivia Mackie have received the paper work from [employer]... Pt stated that the insurance company faxed over the paperwork last Thursday... Pt requesting callback...  ?

## 2021-09-20 DIAGNOSIS — Z6841 Body Mass Index (BMI) 40.0 and over, adult: Secondary | ICD-10-CM | POA: Insufficient documentation

## 2021-09-20 MED ORDER — WEGOVY 0.5 MG/0.5ML ~~LOC~~ SOAJ: 0.5000 mg | SUBCUTANEOUS | 0 refills | Status: DC

## 2021-09-20 MED ORDER — WEGOVY 1.7 MG/0.75ML ~~LOC~~ SOAJ: 1.7000 mg | SUBCUTANEOUS | 0 refills | Status: DC

## 2021-09-20 MED ORDER — WEGOVY 2.4 MG/0.75ML ~~LOC~~ SOAJ: 2.4000 mg | SUBCUTANEOUS | 5 refills | Status: DC

## 2021-09-20 MED ORDER — WEGOVY 1 MG/0.5ML ~~LOC~~ SOAJ: 1.0000 mg | SUBCUTANEOUS | 3 refills | Status: DC

## 2021-09-20 MED ORDER — WEGOVY 0.25 MG/0.5ML ~~LOC~~ SOAJ: 0.2500 mg | SUBCUTANEOUS | 0 refills | Status: DC

## 2021-09-20 NOTE — Addendum Note (Signed)
Addended by: Quentin Ore on: 09/20/2021 09:02 AM ? ? Modules accepted: Orders ? ?

## 2021-09-23 ENCOUNTER — Telehealth: Payer: Self-pay | Admitting: Internal Medicine

## 2021-09-23 ENCOUNTER — Other Ambulatory Visit: Payer: Self-pay | Admitting: Internal Medicine

## 2021-09-23 DIAGNOSIS — J309 Allergic rhinitis, unspecified: Secondary | ICD-10-CM

## 2021-09-23 MED ORDER — MONTELUKAST SODIUM 10 MG PO TABS: 10.0000 mg | ORAL_TABLET | Freq: Every day | ORAL | 3 refills | Status: DC

## 2021-09-23 NOTE — Telephone Encounter (Signed)
Patient would like a refill on her montelukast (SINGULAIR) 10 MG tablet, it is working for her. ?

## 2021-09-27 ENCOUNTER — Telehealth: Payer: Self-pay

## 2021-09-27 NOTE — Telephone Encounter (Signed)
Called pt's pharmacy [facility] on [address] to ensure pt was able to pick up her Semaglutide-Weight Management (WEGOVY) 0.25 MG/0.5ML SOAJ  since we received approval via cover my meds. [facility] stated pt has already P/U her perscription.  ?

## 2021-10-04 ENCOUNTER — Ambulatory Visit
Admission: RE | Admit: 2021-10-04 | Discharge: 2021-10-04 | Disposition: A | Discharge status: Home / Self Care | Payer: BC Managed Care – PPO | Source: Ambulatory Visit | Attending: Internal Medicine | Admitting: Internal Medicine

## 2021-10-04 DIAGNOSIS — Z1231 Encounter for screening mammogram for malignant neoplasm of breast: Secondary | ICD-10-CM | POA: Insufficient documentation

## 2021-10-14 ENCOUNTER — Other Ambulatory Visit: Payer: Self-pay | Admitting: Internal Medicine

## 2021-10-14 DIAGNOSIS — Z6841 Body Mass Index (BMI) 40.0 and over, adult: Secondary | ICD-10-CM

## 2021-10-14 DIAGNOSIS — R7303 Prediabetes: Secondary | ICD-10-CM

## 2021-10-14 DIAGNOSIS — I10 Essential (primary) hypertension: Secondary | ICD-10-CM

## 2022-04-25 ENCOUNTER — Ambulatory Visit: Payer: BC Managed Care – PPO | Admitting: Family Medicine

## 2022-05-11 ENCOUNTER — Other Ambulatory Visit: Payer: Self-pay

## 2022-05-11 DIAGNOSIS — I10 Essential (primary) hypertension: Secondary | ICD-10-CM

## 2022-05-11 MED ORDER — LOSARTAN POTASSIUM-HCTZ 50-12.5 MG PO TABS: ORAL_TABLET | ORAL | 3 refills | Status: DC

## 2022-05-11 NOTE — Telephone Encounter (Signed)
Prescription Request  05/11/2022  Is this a "Controlled Substance" medicine? No  LOV: 09-08-21 Next OV: 08-24-22  What is the name of the medication or equipment? Losartan/HCT 50-12.5 MG tab  Have you contacted your pharmacy to request a refill? No   Which pharmacy would you like this sent to?  [facility] [address], [place] - [address] [place] [address] Phone: [phone] Fax: [phone]    Patient notified that their request is being sent to the clinical staff for review and that they should receive a response within 2 business days.   Please advise at Mobile [phone] (mobile)     Refill of medication sent on today.

## 2022-05-27 ENCOUNTER — Other Ambulatory Visit: Payer: Self-pay

## 2022-05-27 DIAGNOSIS — I10 Essential (primary) hypertension: Secondary | ICD-10-CM

## 2022-05-27 MED ORDER — LOSARTAN POTASSIUM-HCTZ 50-12.5 MG PO TABS: ORAL_TABLET | ORAL | 3 refills | Status: DC

## 2022-08-24 ENCOUNTER — Ambulatory Visit: Payer: BC Managed Care – PPO | Admitting: Family Medicine

## 2022-09-20 ENCOUNTER — Ambulatory Visit: Payer: BC Managed Care – PPO | Admitting: Family Medicine

## 2022-10-04 ENCOUNTER — Ambulatory Visit: Payer: BC Managed Care – PPO | Admitting: Family Medicine

## 2022-11-16 ENCOUNTER — Encounter: Payer: BC Managed Care – PPO | Admitting: Family Medicine

## 2023-01-03 ENCOUNTER — Other Ambulatory Visit: Payer: Self-pay | Admitting: Family

## 2023-01-03 DIAGNOSIS — I10 Essential (primary) hypertension: Secondary | ICD-10-CM

## 2023-01-19 ENCOUNTER — Ambulatory Visit: Payer: BC Managed Care – PPO | Admitting: Family Medicine

## 2023-01-19 ENCOUNTER — Encounter: Payer: Self-pay | Admitting: Family Medicine

## 2023-01-19 VITALS — BP 146/88 | HR 72 | Temp 97.7°F | Resp 16 | Ht 64.5 in | Wt 302.0 lb

## 2023-01-19 DIAGNOSIS — E559 Vitamin D deficiency, unspecified: Secondary | ICD-10-CM | POA: Diagnosis not present

## 2023-01-19 DIAGNOSIS — E785 Hyperlipidemia, unspecified: Secondary | ICD-10-CM | POA: Diagnosis not present

## 2023-01-19 DIAGNOSIS — R7303 Prediabetes: Secondary | ICD-10-CM

## 2023-01-19 DIAGNOSIS — I10 Essential (primary) hypertension: Secondary | ICD-10-CM

## 2023-01-19 DIAGNOSIS — R7309 Other abnormal glucose: Secondary | ICD-10-CM | POA: Diagnosis not present

## 2023-01-19 DIAGNOSIS — Z6841 Body Mass Index (BMI) 40.0 and over, adult: Secondary | ICD-10-CM | POA: Diagnosis not present

## 2023-01-19 DIAGNOSIS — Z1159 Encounter for screening for other viral diseases: Secondary | ICD-10-CM

## 2023-01-19 DIAGNOSIS — J309 Allergic rhinitis, unspecified: Secondary | ICD-10-CM

## 2023-01-19 DIAGNOSIS — E538 Deficiency of other specified B group vitamins: Secondary | ICD-10-CM | POA: Diagnosis not present

## 2023-01-19 DIAGNOSIS — Z114 Encounter for screening for human immunodeficiency virus [HIV]: Secondary | ICD-10-CM

## 2023-01-19 DIAGNOSIS — Z1211 Encounter for screening for malignant neoplasm of colon: Secondary | ICD-10-CM

## 2023-01-19 DIAGNOSIS — Z1231 Encounter for screening mammogram for malignant neoplasm of breast: Secondary | ICD-10-CM

## 2023-01-19 LAB — CBC WITH DIFFERENTIAL/PLATELET
Basophils Absolute: 0.1 10*3/uL (ref 0.0–0.1)
Basophils Relative: 1.2 % (ref 0.0–3.0)
Eosinophils Absolute: 0.1 10*3/uL (ref 0.0–0.7)
Eosinophils Relative: 1.9 % (ref 0.0–5.0)
HCT: 43 % (ref 36.0–46.0)
Hemoglobin: 13.8 g/dL (ref 12.0–15.0)
Lymphocytes Relative: 37.7 % (ref 12.0–46.0)
Lymphs Abs: 2.2 10*3/uL (ref 0.7–4.0)
MCHC: 32.1 g/dL (ref 30.0–36.0)
MCV: 95 fl (ref 78.0–100.0)
Monocytes Absolute: 0.5 10*3/uL (ref 0.1–1.0)
Monocytes Relative: 8.6 % (ref 3.0–12.0)
Neutro Abs: 2.9 10*3/uL (ref 1.4–7.7)
Neutrophils Relative %: 50.6 % (ref 43.0–77.0)
Platelets: 380 10*3/uL (ref 150.0–400.0)
RBC: 4.53 Mil/uL (ref 3.87–5.11)
RDW: 14.1 % (ref 11.5–15.5)
WBC: 5.8 10*3/uL (ref 4.0–10.5)

## 2023-01-19 LAB — COMPREHENSIVE METABOLIC PANEL
ALT: 12 U/L (ref 0–35)
AST: 17 U/L (ref 0–37)
Albumin: 4.1 g/dL (ref 3.5–5.2)
Alkaline Phosphatase: 67 U/L (ref 39–117)
BUN: 13 mg/dL (ref 6–23)
CO2: 29 meq/L (ref 19–32)
Calcium: 9.7 mg/dL (ref 8.4–10.5)
Chloride: 104 meq/L (ref 96–112)
Creatinine, Ser: 0.79 mg/dL (ref 0.40–1.20)
GFR: 84.43 mL/min (ref >60.00)
Glucose, Bld: 96 mg/dL (ref 70–99)
Potassium: 4.6 meq/L (ref 3.5–5.1)
Sodium: 139 meq/L (ref 135–145)
Total Bilirubin: 0.5 mg/dL (ref 0.2–1.2)
Total Protein: 8.3 g/dL (ref 6.0–8.3)

## 2023-01-19 LAB — LIPID PANEL
Cholesterol: 212 mg/dL — ABNORMAL HIGH (ref 0–200)
HDL: 65.1 mg/dL (ref >39.00)
LDL Cholesterol: 130 mg/dL — ABNORMAL HIGH (ref 0–99)
NonHDL: 147.27
Total CHOL/HDL Ratio: 3
Triglycerides: 84 mg/dL (ref 0.0–149.0)
VLDL: 16.8 mg/dL (ref 0.0–40.0)

## 2023-01-19 LAB — HEMOGLOBIN A1C: Hgb A1c MFr Bld: 5.6 % (ref 4.6–6.5)

## 2023-01-19 MED ORDER — LOSARTAN POTASSIUM-HCTZ 50-12.5 MG PO TABS: ORAL_TABLET | ORAL | 3 refills | Status: DC

## 2023-01-19 MED ORDER — SALINE SPRAY 0.65 % NA SOLN
2.0000 | Freq: Every day | NASAL | 12 refills | Status: AC | PRN
Start: 2023-01-19 — End: ?

## 2023-01-19 MED ORDER — FLUTICASONE PROPIONATE 50 MCG/ACT NA SUSP: 2.0000 | Freq: Every day | NASAL | 6 refills | Status: AC

## 2023-01-19 MED ORDER — MONTELUKAST SODIUM 10 MG PO TABS
10.0000 mg | ORAL_TABLET | Freq: Every day | ORAL | 3 refills | Status: AC
Start: 2023-01-19 — End: ?

## 2023-01-19 NOTE — Patient Instructions (Addendum)
It was a pleasure meeting you today. Thank you for allowing me to take part in your health care.  Our goals for today as we discussed include:  We will get some labs today.  If they are abnormal or we need to do something about them, I will call you.  If they are normal, I will send you a message on MyChart (if it is active) or a letter in the mail.  If you don't hear from [place] in 2 weeks, please call the office at the number below.   Referral sent for Mammogram. Please call to schedule appointment. Due 09/2023 [facility] [address] [place], [place] [address] [phone]    Referral sent for colonoscopy  Refills sent for requested medications  Tetanus vaccine due 2026  Recommend Shingles vaccine.  This is a 2 dose series and can be given at your local pharmacy.  Please talk to your pharmacist about this.     Options for weight loss. [facility] [address] [place] [phone]    Delrae Rend MD Address: [address] [place], [place], [place] [address] Phone: [phone]   Follow up in nurse clinic in 1 week for blood pressure check  Follow up with PCP as needed   If you have any questions or concerns, please do not hesitate to call the office at [phone].  I look forward to our next visit and until then take care and stay safe.  Regards,   Dana Allan, MD   [facility]

## 2023-01-19 NOTE — Progress Notes (Signed)
SUBJECTIVE:   Chief Complaint  Patient presents with   Establish Care   HPI Presents to clinic to establish care  No acute concerns   HTN Asymptomatic.  BP at home 112/70.  Takes Hyzaar 50-12.5 mg daily.  Has not taken medication today. Requesting refill.  Requesting refill for Zyrtec, Flonase, Singulair and Nasal saline solution   PERTINENT PMH / PSH: HTN  OBJECTIVE:  BP (!) 146/88   Pulse 72   Temp 97.7 F (36.5 C)   Resp 16   Ht 5' 4.5" (1.638 m)   Wt (!) 302 lb (137 kg)   LMP 06/09/2015   SpO2 97%   BMI 51.04 kg/m    Physical Exam Vitals reviewed.  Constitutional:      General: She is not in acute distress.    Appearance: She is not ill-appearing.  HENT:     Head: Normocephalic.     Right Ear: Tympanic membrane, ear canal and external ear normal.     Left Ear: Tympanic membrane, ear canal and external ear normal.     Nose: Nose normal.     Mouth/Throat:     Mouth: Mucous membranes are moist.  Eyes:     Extraocular Movements: Extraocular movements intact.     Conjunctiva/sclera: Conjunctivae normal.     Pupils: Pupils are equal, round, and reactive to light.  Neck:     Thyroid: No thyromegaly or thyroid tenderness.     Vascular: No carotid bruit.  Cardiovascular:     Rate and Rhythm: Normal rate and regular rhythm.     Pulses: Normal pulses.     Heart sounds: Normal heart sounds.  Pulmonary:     Effort: Pulmonary effort is normal.     Breath sounds: Normal breath sounds.  Abdominal:     General: Bowel sounds are normal. There is no distension.     Palpations: Abdomen is soft.     Tenderness: There is no abdominal tenderness. There is no right CVA tenderness, left CVA tenderness, guarding or rebound.  Musculoskeletal:        General: Normal range of motion.     Cervical back: Normal range of motion.     Right lower leg: No edema.     Left lower leg: No edema.  Lymphadenopathy:     Cervical: No cervical adenopathy.  Skin:    Capillary  Refill: Capillary refill takes less than 2 seconds.  Neurological:     General: No focal deficit present.     Mental Status: She is alert and oriented to person, place, and time. Mental status is at baseline.     Motor: No weakness.  Psychiatric:        Mood and Affect: Mood normal.        Behavior: Behavior normal.        Thought Content: Thought content normal.        Judgment: Judgment normal.        01/19/2023    9:28 AM 09/08/2021    9:30 AM 05/19/2021    1:53 PM  Depression screen PHQ 2/9  Decreased Interest 0 0 0  Down, Depressed, Hopeless 0 0 0  PHQ - 2 Score 0 0 0  Altered sleeping 1    Tired, decreased energy 1    Change in appetite 1    Feeling bad or failure about yourself  1    Trouble concentrating 0    Moving slowly or fidgety/restless 0    Suicidal thoughts  0    PHQ-9 Score 4    Difficult doing work/chores Not difficult at all        01/19/2023    9:28 AM  GAD 7 : Generalized Anxiety Score  Nervous, Anxious, on Edge 0  Control/stop worrying 0  Worry too much - different things 1  Trouble relaxing 0  Restless 0  Easily annoyed or irritable 0  Afraid - awful might happen 0  Total GAD 7 Score 1  Anxiety Difficulty Not difficult at all    ASSESSMENT/PLAN:  Abnormal glucose -     Hemoglobin A1c  Hypertension, unspecified type Assessment & Plan: Chronic Elevated likely due to no medication today Refill Hyzaar 50-12.5 mg daily Follow up in 1 week RN clinic for repeat check Check Cmet  Orders: -     Losartan Potassium-HCTZ; 1/2 pill in the am x 3 day sand 1 pill day 4 and beyond  Dispense: 30 tablet; Refill: 3 -     Comprehensive metabolic panel -     CBC with Differential/Platelet  Hyperlipidemia, unspecified hyperlipidemia type Assessment & Plan: Check Lipids  Orders: -     Lipid panel  Vitamin D deficiency -     VITAMIN D 25 Hydroxy (Vit-D Deficiency, Fractures)  Morbid obesity with BMI of 45.0-49.9, adult [facility]) Assessment &  Plan: Check labs Not currently on Wegovy due to insurance coverage  Orders: -     TSH  Colon cancer screening -     Ambulatory referral to Gastroenterology  Breast cancer screening by mammogram -     3D Screening Mammogram, Left and Right; Future  Vitamin B 12 deficiency -     Vitamin B12  Allergic rhinitis, unspecified seasonality, unspecified trigger -     Montelukast Sodium; Take 1 tablet (10 mg total) by mouth at bedtime.  Dispense: 90 tablet; Refill: 3 -     Saline Spray; Place 2 sprays into both nostrils daily as needed for congestion. Followed by flonase 2 sprays daily prn  Dispense: 30 mL; Refill: 12 -     Fluticasone Propionate; Place 2 sprays into both nostrils daily.  Dispense: 16 g; Refill: 6  Encounter for screening for HIV -     Hepatitis C antibody  Need for hepatitis C screening test -     HIV Antibody (routine testing w rflx)  Prediabetes Assessment & Plan: Check A1c     Referral sent for Colonoscopy  PDMP reviewed  Return if symptoms worsen or fail to improve, for PCP.  Dana Allan, MD

## 2023-01-20 LAB — HEPATITIS C ANTIBODY: Hepatitis C Ab: NONREACTIVE

## 2023-01-20 LAB — VITAMIN D 25 HYDROXY (VIT D DEFICIENCY, FRACTURES): VITD: 56.67 ng/mL (ref 30.00–100.00)

## 2023-01-20 LAB — HIV ANTIBODY (ROUTINE TESTING W REFLEX): HIV 1&2 Ab, 4th Generation: NONREACTIVE

## 2023-01-20 LAB — TSH: TSH: 1.1 u[IU]/mL (ref 0.35–5.50)

## 2023-01-20 LAB — VITAMIN B12: Vitamin B-12: >1501 pg/mL — ABNORMAL HIGH (ref 211–911)

## 2023-02-04 ENCOUNTER — Encounter: Payer: Self-pay | Admitting: Family Medicine

## 2023-02-04 DIAGNOSIS — Z1159 Encounter for screening for other viral diseases: Secondary | ICD-10-CM | POA: Insufficient documentation

## 2023-02-04 DIAGNOSIS — Z1231 Encounter for screening mammogram for malignant neoplasm of breast: Secondary | ICD-10-CM | POA: Insufficient documentation

## 2023-02-04 DIAGNOSIS — Z1211 Encounter for screening for malignant neoplasm of colon: Secondary | ICD-10-CM | POA: Insufficient documentation

## 2023-02-04 DIAGNOSIS — Z114 Encounter for screening for human immunodeficiency virus [HIV]: Secondary | ICD-10-CM | POA: Insufficient documentation

## 2023-02-04 DIAGNOSIS — J309 Allergic rhinitis, unspecified: Secondary | ICD-10-CM | POA: Insufficient documentation

## 2023-02-04 DIAGNOSIS — R7309 Other abnormal glucose: Secondary | ICD-10-CM | POA: Insufficient documentation

## 2023-02-04 DIAGNOSIS — E538 Deficiency of other specified B group vitamins: Secondary | ICD-10-CM | POA: Insufficient documentation

## 2023-02-04 NOTE — Assessment & Plan Note (Addendum)
Chronic Elevated likely due to no medication today Refill Hyzaar 50-12.5 mg daily Follow up in 1 week RN clinic for repeat check Check Cmet

## 2023-02-04 NOTE — Assessment & Plan Note (Signed)
Check A1c. 

## 2023-02-04 NOTE — Assessment & Plan Note (Signed)
Check Lipids

## 2023-02-04 NOTE — Assessment & Plan Note (Signed)
Check labs Not currently on Wegovy due to insurance coverage

## 2023-05-19 ENCOUNTER — Other Ambulatory Visit: Payer: Self-pay | Admitting: Family Medicine

## 2023-05-19 DIAGNOSIS — I10 Essential (primary) hypertension: Secondary | ICD-10-CM

## 2023-05-21 ENCOUNTER — Other Ambulatory Visit: Payer: Self-pay | Admitting: Family Medicine

## 2023-05-21 DIAGNOSIS — I10 Essential (primary) hypertension: Secondary | ICD-10-CM

## 2023-06-06 ENCOUNTER — Other Ambulatory Visit: Payer: Self-pay | Admitting: Family Medicine

## 2023-06-06 DIAGNOSIS — I10 Essential (primary) hypertension: Secondary | ICD-10-CM

## 2023-06-26 ENCOUNTER — Telehealth: Payer: Self-pay

## 2023-06-26 NOTE — Telephone Encounter (Signed)
Called pt and informed that pharmacy was refilling her medication, and if there was any question please call the office at [phone].

## 2023-07-24 ENCOUNTER — Other Ambulatory Visit: Payer: Self-pay | Admitting: Family Medicine

## 2023-07-24 DIAGNOSIS — I10 Essential (primary) hypertension: Secondary | ICD-10-CM

## 2023-07-24 NOTE — Telephone Encounter (Signed)
 Called pt and advised her she needs an appointment to get medication refilled, and she stated that she has to work. The only time she can come in is in the summer.
